# Patient Record
Sex: Female | Born: 1975
Health system: Southern US, Community
[De-identification: ages and names within clinical notes are randomized; demographics above are authoritative.]

## PROBLEM LIST (undated history)

## (undated) DIAGNOSIS — E079 Disorder of thyroid, unspecified: Secondary | ICD-10-CM

## (undated) DIAGNOSIS — F419 Anxiety disorder, unspecified: Secondary | ICD-10-CM

## (undated) DIAGNOSIS — T7840XA Allergy, unspecified, initial encounter: Secondary | ICD-10-CM

## (undated) DIAGNOSIS — I639 Cerebral infarction, unspecified: Secondary | ICD-10-CM

## (undated) DIAGNOSIS — F32A Depression, unspecified: Secondary | ICD-10-CM

## (undated) HISTORY — DX: Depression, unspecified: F32.A

## (undated) HISTORY — DX: Allergy, unspecified, initial encounter: T78.40XA

## (undated) HISTORY — DX: Cerebral infarction, unspecified: I63.9

## (undated) HISTORY — DX: Disorder of thyroid, unspecified: E07.9

---

## 2000-01-28 ENCOUNTER — Emergency Department (HOSPITAL_COMMUNITY): Admission: EM | Admit: 2000-01-28 | Discharge: 2000-01-28 | Payer: Self-pay | Admitting: Emergency Medicine

## 2002-05-07 ENCOUNTER — Emergency Department (HOSPITAL_COMMUNITY): Admission: EM | Admit: 2002-05-07 | Discharge: 2002-05-07 | Payer: Self-pay | Admitting: Emergency Medicine

## 2002-09-06 ENCOUNTER — Emergency Department (HOSPITAL_COMMUNITY): Admission: EM | Admit: 2002-09-06 | Discharge: 2002-09-06 | Payer: Self-pay | Admitting: Emergency Medicine

## 2006-04-26 ENCOUNTER — Inpatient Hospital Stay (HOSPITAL_COMMUNITY): Admission: AD | Admit: 2006-04-26 | Discharge: 2006-04-26 | Payer: Self-pay | Admitting: Obstetrics and Gynecology

## 2006-05-03 ENCOUNTER — Inpatient Hospital Stay (HOSPITAL_COMMUNITY): Admission: RE | Admit: 2006-05-03 | Discharge: 2006-05-03 | Payer: Self-pay | Admitting: Obstetrics & Gynecology

## 2006-05-04 ENCOUNTER — Encounter (INDEPENDENT_AMBULATORY_CARE_PROVIDER_SITE_OTHER): Payer: Self-pay | Admitting: Specialist

## 2006-05-04 ENCOUNTER — Ambulatory Visit (HOSPITAL_COMMUNITY): Admission: RE | Admit: 2006-05-04 | Discharge: 2006-05-04 | Payer: Self-pay | Admitting: Obstetrics and Gynecology

## 2006-05-23 ENCOUNTER — Inpatient Hospital Stay (HOSPITAL_COMMUNITY): Admission: AD | Admit: 2006-05-23 | Discharge: 2006-05-23 | Payer: Self-pay | Admitting: Obstetrics and Gynecology

## 2007-02-07 ENCOUNTER — Emergency Department (HOSPITAL_COMMUNITY): Admission: EM | Admit: 2007-02-07 | Discharge: 2007-02-07 | Payer: Self-pay | Admitting: Family Medicine

## 2008-04-28 ENCOUNTER — Emergency Department (HOSPITAL_COMMUNITY): Admission: EM | Admit: 2008-04-28 | Discharge: 2008-04-28 | Payer: Self-pay | Admitting: Emergency Medicine

## 2010-12-25 NOTE — Op Note (Signed)
Gina Ayala, Gina Ayala               ACCOUNT NO.:  [id number]   MEDICAL RECORD NO.:  [id number]          PATIENT TYPE:  AMB   LOCATION:  SDC                           FACILITY:  [facility]   PHYSICIAN:  Gerald Leitz, MD          DATE OF BIRTH:  1976-02-07   DATE OF PROCEDURE:  05/04/2006  DATE OF DISCHARGE:  05/04/2006                                 OPERATIVE REPORT   PREOPERATIVE DIAGNOSIS:  Missed abortion.   POSTOPERATIVE DIAGNOSIS:  Missed abortion.   PROCEDURE:  Dilatation and curettage.   SURGEON:  Gerald Leitz, MD   ASSISTANT:  None.   ANESTHESIA:  MAC.   SPECIMENS:  Products of conception, sent to Pathology.   ESTIMATED BLOOD LOSS:  Minimal.   COMPLICATIONS:  None.   INDICATION:  This is a 35 year old at approximately 7 weeks 5 days'  estimated gestational age with irregular gestational sac, no fluid, fetal  pole or yolk sac on ultrasound, quantitative beta hCG of 19,000, diagnosed  with a blighted ovum.  Risks, benefits and alternatives of the surgery were  discussed with the patient; she desired dilation and suction curettage.   DESCRIPTION OF PROCEDURE:  She was taken to the operating room and placed  under MAC.  She was then prepped and draped in the usual sterile fashion.  A  bivalved speculum was placed in the vaginal vault.  The anterior lip of the  cervix was grasped with a single-tooth tenaculum.  Paracervical block was  then obtained with 10 mL of 0.25% Marcaine plain.  The uterus was sounded to  7 cm.  The cervix was dilated up to a #21 dilator.  A 7-mm suction curette  was then introduced and products of conception were removed using usual  sterile technique.  Light sharp curettage was performed until a gritty  texture was noted all the way around and suction curettage was performed  once again to make sure all products were removed.  Single-tooth tenaculum  was removed from the patient's cervix; excellent hemostasis was noted.  The  speculum was removed from the  vagina.  The patient was awakened from MAC and  taken to the recovery room, awake and in stable condition.     Gerald Leitz, MD  Electronically Signed    TC/MEDQ  D:  05/04/2006  T:  05/06/2006  Job:  [id number]

## 2010-12-25 NOTE — H&P (Signed)
NAMEMELANIA, Gina Ayala               ACCOUNT NO.:  [id number]   MEDICAL RECORD NO.:  [id number]          PATIENT TYPE:  MAT   LOCATION:  MATC                          FACILITY:  [facility]   PHYSICIAN:  Gerald Leitz, MD          DATE OF BIRTH:  May 12, 1976   DATE OF ADMISSION:  05/03/2006  DATE OF DISCHARGE:                                HISTORY & PHYSICAL   HISTORY OF PRESENT ILLNESS:  This is a 35 year old G 5, P 1-0-3-1, at  approximately 7 weeks 5 days estimated gestational age with a missed AB.  She had an ultrasound on April 26, 2006, at [facility] that showed  an intrauterine gestational sack measuring 17.6-mm.  No yolk sack or fetal  pole was seen.  She was estimated to be 6 weeks and 5 days at that time.  She returned on May 04, 2006, had a repeat ultrasound and the  gestational sack had increased, however, there was no fetal pole, diagnosed  with a blighted ovum.  After counseling and all options were discussed, the  patient decided on dilatation and suction curettage.   CURRENT MEDICATIONS:  Xanax 1 mg three times a day for anxiety.   ALLERGIES:  NO KNOWN DRUG ALLERGIES.   PAST MEDICAL HISTORY:  1. Anxiety.  2. Migraines.   SURGERY:  1. D&C.  2. C-section x1.   PAST OBSTETRICAL HISTORY:  1. C-section x1.  2. SAB x2.  3. IAB x1.   PAST GYNECOLOGIC HISTORY:  1. History of Chlamydia.  2. Urinary tract infections.  3. History of abnormal Pap smears.   SOCIAL HISTORY:  The patient is divorced.  Reports occasional alcohol.  She  smokes 1/2 pack per day.  Denies any current illicit drug use.   FAMILY HISTORY:  Noncontributory.   REVIEW OF SYSTEMS:  Positive for nausea otherwise negative except as stated  in history of current illness.  Denies any vaginal bleeding.  No passage of  tissue.   PHYSICAL EXAMINATION:  VITAL SIGNS:  Blood pressure 100/70, heart rate 68,  height 5 feet 6-1/2 inches, weight 130 pounds.  CARDIOVASCULAR:  Regular rate and rhythm.  LUNGS:  Clear to auscultation bilaterally.  ABDOMEN:  Soft, nontender, nondistended.  No masses.  PELVIC:  Normal external female genitalia.  No vulvar, vaginal, cervical  lesions are noted.  Bimanual exam reveals a normal size uterus.  No adnexal  masses or tenderness.   LABORATORY:  Blood type A positive.  Beta HCG on April 26, 2006, was  19,484.   ASSESSMENT:  1. Missed abortion.  2. Blighted ovum.   PLAN:  Risks, benefits, alternatives of D&C were discussed with the patient  including, but not limited to, infection, bleeding, damage to the uterus,  uterine perforation with need for further surgery, risk of transfusion, HIV,  hepatitis B and C were discussed with the patient and the patient  understands all risks and alternatives, desires to proceed with dilatation  and suction curettage.      Gerald Leitz, MD  Electronically Signed     TC/MEDQ  D:  05/03/2006  T:  05/03/2006  Job:  [id number]

## 2015-05-19 ENCOUNTER — Emergency Department (HOSPITAL_COMMUNITY)
Admission: EM | Admit: 2015-05-19 | Discharge: 2015-05-19 | Disposition: A | Discharge status: Home / Self Care | Payer: Self-pay

## 2015-05-19 NOTE — ED Notes (Signed)
Pt not in lobby when called, per family member, pt left due wait time.

## 2015-08-10 DIAGNOSIS — I639 Cerebral infarction, unspecified: Secondary | ICD-10-CM

## 2015-08-10 HISTORY — DX: Cerebral infarction, unspecified: I63.9

## 2015-11-11 ENCOUNTER — Encounter (HOSPITAL_COMMUNITY): Payer: Self-pay

## 2015-11-11 ENCOUNTER — Emergency Department (HOSPITAL_COMMUNITY): Payer: Self-pay

## 2015-11-11 ENCOUNTER — Inpatient Hospital Stay (HOSPITAL_COMMUNITY)
Admission: EM | Admit: 2015-11-11 | Discharge: 2015-11-14 | DRG: 065 | Disposition: A | Discharge status: Home / Self Care | Payer: Self-pay | Attending: Internal Medicine | Admitting: Internal Medicine

## 2015-11-11 DIAGNOSIS — I63411 Cerebral infarction due to embolism of right middle cerebral artery: Principal | ICD-10-CM | POA: Diagnosis present

## 2015-11-11 DIAGNOSIS — R4781 Slurred speech: Secondary | ICD-10-CM | POA: Diagnosis present

## 2015-11-11 DIAGNOSIS — G459 Transient cerebral ischemic attack, unspecified: Secondary | ICD-10-CM | POA: Insufficient documentation

## 2015-11-11 DIAGNOSIS — R51 Headache: Secondary | ICD-10-CM

## 2015-11-11 DIAGNOSIS — R519 Headache, unspecified: Secondary | ICD-10-CM

## 2015-11-11 DIAGNOSIS — F17200 Nicotine dependence, unspecified, uncomplicated: Secondary | ICD-10-CM | POA: Diagnosis present

## 2015-11-11 DIAGNOSIS — E876 Hypokalemia: Secondary | ICD-10-CM | POA: Diagnosis present

## 2015-11-11 DIAGNOSIS — F411 Generalized anxiety disorder: Secondary | ICD-10-CM | POA: Diagnosis present

## 2015-11-11 DIAGNOSIS — R29810 Facial weakness: Secondary | ICD-10-CM | POA: Diagnosis present

## 2015-11-11 DIAGNOSIS — I639 Cerebral infarction, unspecified: Secondary | ICD-10-CM

## 2015-11-11 DIAGNOSIS — I63511 Cerebral infarction due to unspecified occlusion or stenosis of right middle cerebral artery: Secondary | ICD-10-CM | POA: Diagnosis present

## 2015-11-11 DIAGNOSIS — E872 Acidosis: Secondary | ICD-10-CM | POA: Diagnosis present

## 2015-11-11 DIAGNOSIS — R29700 NIHSS score 0: Secondary | ICD-10-CM | POA: Diagnosis present

## 2015-11-11 HISTORY — DX: Anxiety disorder, unspecified: F41.9

## 2015-11-11 LAB — COMPREHENSIVE METABOLIC PANEL
ALT: 67 U/L — ABNORMAL HIGH (ref 14–54)
ANION GAP: 11 (ref 5–15)
AST: 54 U/L — AB (ref 15–41)
Albumin: 3.8 g/dL (ref 3.5–5.0)
Alkaline Phosphatase: 58 U/L (ref 38–126)
BILIRUBIN TOTAL: 0.7 mg/dL (ref 0.3–1.2)
BUN: 8 mg/dL (ref 6–20)
CHLORIDE: 105 mmol/L (ref 101–111)
CO2: 24 mmol/L (ref 22–32)
Calcium: 9.4 mg/dL (ref 8.9–10.3)
Creatinine, Ser: 0.71 mg/dL (ref 0.44–1.00)
Glucose, Bld: 128 mg/dL — ABNORMAL HIGH (ref 65–99)
POTASSIUM: 3.3 mmol/L — AB (ref 3.5–5.1)
Sodium: 140 mmol/L (ref 135–145)
Total Protein: 6.8 g/dL (ref 6.5–8.1)

## 2015-11-11 LAB — I-STAT TROPONIN, ED: Troponin i, poc: 0 ng/mL (ref 0.00–0.08)

## 2015-11-11 LAB — CBC
HEMATOCRIT: 45.8 % (ref 36.0–46.0)
HEMATOCRIT: 44.7 % (ref 36.0–46.0)
HEMOGLOBIN: 16.2 g/dL — AB (ref 12.0–15.0)
Hemoglobin: 15.5 g/dL — ABNORMAL HIGH (ref 12.0–15.0)
MCH: 32.6 pg (ref 26.0–34.0)
MCH: 33.9 pg (ref 26.0–34.0)
MCHC: 35.4 g/dL (ref 30.0–36.0)
MCHC: 34.7 g/dL (ref 30.0–36.0)
MCV: 93.9 fL (ref 78.0–100.0)
MCV: 95.8 fL (ref 78.0–100.0)
PLATELETS: 379 10*3/uL (ref 150–400)
Platelets: 420 10*3/uL — ABNORMAL HIGH (ref 150–400)
RBC: 4.76 MIL/uL (ref 3.87–5.11)
RBC: 4.78 MIL/uL (ref 3.87–5.11)
RDW: 13 % (ref 11.5–15.5)
RDW: 12.8 % (ref 11.5–15.5)
WBC: 12.3 10*3/uL — ABNORMAL HIGH (ref 4.0–10.5)
WBC: 11.3 10*3/uL — AB (ref 4.0–10.5)

## 2015-11-11 LAB — APTT: APTT: 26 s (ref 24–37)

## 2015-11-11 LAB — DIFFERENTIAL
BASOS PCT: 0 %
Basophils Absolute: 0 10*3/uL (ref 0.0–0.1)
EOS ABS: 0.1 10*3/uL (ref 0.0–0.7)
EOS PCT: 1 %
LYMPHS ABS: 3.5 10*3/uL (ref 0.7–4.0)
Lymphocytes Relative: 28 %
MONO ABS: 1.4 10*3/uL — AB (ref 0.1–1.0)
MONOS PCT: 11 %
Neutro Abs: 7.3 10*3/uL (ref 1.7–7.7)
Neutrophils Relative %: 60 %

## 2015-11-11 LAB — I-STAT CHEM 8, ED
BUN: 8 mg/dL (ref 6–20)
CREATININE: 0.7 mg/dL (ref 0.44–1.00)
Calcium, Ion: 1.14 mmol/L (ref 1.12–1.23)
Chloride: 102 mmol/L (ref 101–111)
GLUCOSE: 120 mg/dL — AB (ref 65–99)
HCT: 46 % (ref 36.0–46.0)
HEMOGLOBIN: 15.6 g/dL — AB (ref 12.0–15.0)
POTASSIUM: 3.2 mmol/L — AB (ref 3.5–5.1)
Sodium: 140 mmol/L (ref 135–145)
TCO2: 25 mmol/L (ref 0–100)

## 2015-11-11 LAB — PROTIME-INR
INR: 1.05 (ref 0.00–1.49)
Prothrombin Time: 13.9 seconds (ref 11.6–15.2)

## 2015-11-11 MED ORDER — ALPRAZOLAM 0.5 MG PO TABS
0.5000 mg | ORAL_TABLET | Freq: Every evening | ORAL | Status: DC | PRN
  Administered 2015-11-11: 0.5 mg via ORAL
  Filled 2015-11-11: qty 1

## 2015-11-11 MED ORDER — STROKE: EARLY STAGES OF RECOVERY BOOK
Freq: Once | Status: AC
  Administered 2015-11-11: 23:00:00

## 2015-11-11 MED ORDER — SODIUM CHLORIDE 0.9 % IV SOLN
INTRAVENOUS | Status: AC
  Administered 2015-11-11 – 2015-11-12 (×2): via INTRAVENOUS

## 2015-11-11 MED ORDER — ASPIRIN 300 MG RE SUPP: 300.0000 mg | Freq: Every day | RECTAL | Status: DC

## 2015-11-11 MED ORDER — ENOXAPARIN SODIUM 40 MG/0.4ML ~~LOC~~ SOLN
40.0000 mg | Freq: Every day | SUBCUTANEOUS | Status: DC
  Administered 2015-11-11 – 2015-11-13 (×3): 40 mg via SUBCUTANEOUS
  Filled 2015-11-11 (×3): qty 0.4

## 2015-11-11 MED ORDER — ACETAMINOPHEN 325 MG PO TABS
650.0000 mg | ORAL_TABLET | Freq: Four times a day (QID) | ORAL | Status: DC | PRN
  Administered 2015-11-11 – 2015-11-12 (×2): 650 mg via ORAL
  Filled 2015-11-11 (×2): qty 2

## 2015-11-11 MED ORDER — ASPIRIN 325 MG PO TABS
325.0000 mg | ORAL_TABLET | Freq: Every day | ORAL | Status: DC
  Administered 2015-11-12 – 2015-11-13 (×2): 325 mg via ORAL
  Filled 2015-11-11 (×3): qty 1

## 2015-11-11 MED ORDER — SENNOSIDES-DOCUSATE SODIUM 8.6-50 MG PO TABS: 1.0000 | ORAL_TABLET | Freq: Every evening | ORAL | Status: DC | PRN

## 2015-11-11 NOTE — ED Provider Notes (Signed)
CSN: [id number]     Arrival date & time 11/11/15  1744 History   First MD Initiated Contact with Patient 11/11/15 1749     Chief Complaint  Patient presents with  . Numbness     (Consider location/radiation/quality/duration/timing/severity/associated sxs/prior Treatment) HPI  40 year old female resents with acute left-sided weakness and slurred speech with facial droop. History is taken by significant other who is at the bedside. Patient about one hour prior to arrival was getting ready to take a nap and then all of a sudden her face seemed to freeze she seemed to not be acting right and not be there. She fell off the bed in between the bed and dresser. She did not seem to injure her head and has no headache. Immediately after she was acting confused but also could not move her left arm. She had picked up with her right arm. She also had a facial droop and some slurred speech. This lasted several minutes until EMS arrived and her symptoms seemed to resolve. She denies facial numbness, numbness or weakness in her extremity, or headache. Patient is a daily smoker but has not smoked in 1 week and plans on quitting. No other medical problems.  Past Medical History  Diagnosis Date  . Anxiety    History reviewed. No pertinent past surgical history. No family history on file. Social History  Substance Use Topics  . Smoking status: Current Every Day Smoker  . Smokeless tobacco: None  . Alcohol Use: None   OB History    No data available     Review of Systems  Respiratory: Negative for shortness of breath.   Cardiovascular: Negative for chest pain.  Gastrointestinal: Negative for vomiting.  Musculoskeletal: Negative for neck pain.  Neurological: Positive for speech difficulty, weakness and numbness. Negative for headaches.  All other systems reviewed and are negative.     Allergies  Review of patient's allergies indicates not on file.  Home Medications   Prior to Admission  medications   Not on File   BP 116/88 mmHg  Pulse 83  Temp(Src) 98.4 F (36.9 C) (Oral)  Resp 16  Ht  (1.676 m)  Wt 180 lb (81.647 kg)  BMI 29.07 kg/m2  SpO2 94% Physical Exam  Constitutional: She is oriented to person, place, and time. She appears well-developed and well-nourished.  HENT:  Head: Normocephalic and atraumatic.  Right Ear: External ear normal.  Left Ear: External ear normal.  Nose: Nose normal.  No signs of head trauma  Eyes: EOM are normal. Pupils are equal, round, and reactive to light. Right eye exhibits no discharge. Left eye exhibits no discharge.  Neck: Neck supple.  Cardiovascular: Normal rate, regular rhythm and normal heart sounds.   Pulmonary/Chest: Effort normal and breath sounds normal.  Abdominal: Soft. There is no tenderness.  Neurological: She is alert and oriented to person, place, and time.  CN 2-12 grossly intact. 5/5 strength in all 4 extremities. Normal gross sensation. Normal finger to nose  Skin: Skin is warm and dry.  Nursing note and vitals reviewed.   ED Course  Procedures (including critical care time) Labs Review Labs Reviewed  CBC - Abnormal; Notable for the following:    WBC 12.3 (*)    Hemoglobin 16.2 (*)    Platelets 420 (*)    All other components within normal limits  DIFFERENTIAL - Abnormal; Notable for the following:    Monocytes Absolute 1.4 (*)    All other components within normal limits  COMPREHENSIVE METABOLIC PANEL - Abnormal; Notable for the following:    Potassium 3.3 (*)    Glucose, Bld 128 (*)    AST 54 (*)    ALT 67 (*)    All other components within normal limits  I-STAT CHEM 8, ED - Abnormal; Notable for the following:    Potassium 3.2 (*)    Glucose, Bld 120 (*)    Hemoglobin 15.6 (*)    All other components within normal limits  PROTIME-INR  APTT  ETHANOL  URINE RAPID DRUG SCREEN, HOSP PERFORMED  URINALYSIS, ROUTINE W REFLEX MICROSCOPIC (NOT AT [facility])  PREGNANCY, URINE  ANTITHROMBIN III   PROTEIN C ACTIVITY  PROTEIN C, TOTAL  PROTEIN S ACTIVITY  PROTEIN S, TOTAL  LUPUS ANTICOAGULANT PANEL  BETA-2-GLYCOPROTEIN I ABS, IGG/M/A  HOMOCYSTEINE  FACTOR 5 LEIDEN  PROTHROMBIN GENE MUTATION  CARDIOLIPIN ANTIBODIES, IGG, IGM, IGA  HEMOGLOBIN A1C  LIPID PANEL  CBC  CREATININE, SERUM  COMPREHENSIVE METABOLIC PANEL  CBC  I-STAT TROPOININ, ED    Imaging Review Mr Brain Wo Contrast (neuro Protocol)  11/11/2015  CLINICAL DATA:  Transient left-sided weakness. History of anxiety, over the counter cold medications, and daily smoking history. Symptoms reportedly developed earlier today at 1715 hours. Patient states she is back to normal. EXAM: MRI HEAD WITHOUT CONTRAST TECHNIQUE: Multiplanar, multiecho pulse sequences of the brain and surrounding structures were obtained without intravenous contrast. COMPARISON:  None. FINDINGS: Slight motion.  Overall study diagnostic. Multifocal areas of acute infarction affect the RIGHT hemisphere. These are most notable in the RIGHT insula, RIGHT posterior temporal lobe, RIGHT anterior parietal lobe, RIGHT frontal and RIGHT posterior frontal lobe, and regional white matter. The largest confluent area affects the RIGHT parietal lobe, and could correlate with the patient's sensory symptoms. Within limits for detection on MR, these infarcts are all are nonhemorrhagic. No left-sided or posterior circulation involvement. No hemorrhage, mass lesion, hydrocephalus, or extra-axial fluid. Slight premature for age atrophy. Minor white matter disease, nonspecific. Flow voids are maintained. No visible stenosis, dissection, or large vessel occlusion No midline abnormality. Extracranial soft tissues unremarkable. LEFT vertebral dominant. IMPRESSION: Multifocal areas of acute infarction affecting the RIGHT MCA territory. These appear nonhemorrhagic, but noncontrast CT should be obtained to confirm. Slight premature for age atrophy, minor white matter disease, nonspecific.  These results were called by telephone at the time of interpretation on 11/11/2015 at 8:59 pm to Dr. Pricilla Loveless , who verbally acknowledged these results. Electronically Signed   By: Elsie Stain M.D.   On: 11/11/2015 21:01   I have personally reviewed and evaluated these images and lab results as part of my medical decision-making.   EKG Interpretation   Date/Time:  Tuesday November 11 2015 18:52:39 EDT Ventricular Rate:  81 PR Interval:  177 QRS Duration: 98 QT Interval:  389 QTC Calculation: 451 R Axis:   89 Text Interpretation:  Sinus rhythm Normal ECG No old tracing to compare  Confirmed by Aizlynn Digilio  MD, Horst Ostermiller ([id number]) on 11/11/2015 7:20:01 PM      MDM   Final diagnoses:  None    She is completely asymptomatic on arrival. Patient has not had any return of symptoms. Symptoms are most consistent with a TIA. Discussed with Dr. Lavon Paganini recommends MRI and skips head CT given no headache and low suspicion for bleed. MRI does show areas of infarct in right MCA territory. On reexamination patient is still asymptomatic. Discussed with Dr. Cherylynn Ridges who will consult, recommends hospitalist admission for TIA workup.  Pricilla LovelessScott Tama Grosz, MD 11/11/15 [id number]

## 2015-11-11 NOTE — H&P (Signed)
[facility] History and Physical  Gina RubensteinChrista L Ayala UJW:[id number]:[id number] DOB: Oct 09, 1975 DOA: 11/11/2015  Referring physician: Malachi Carlr.[place]. PCP: No PCP Per Patient  Specialists: None.  Chief Complaint: Left-sided weakness and loss of consciousness.  HPI: Gina Ayala is a 40 y.o. female   with history of tobacco abuse at around 5 PM this evening suddenly lost consciousness while she was with her husband. On waking up immediately patient found that she was unable to move her left upper extremity and also the husband noticed left facial droop. By the time patient reached ER patient's focal deficits and resolved. Denies any difficulty seeing speaking or swallowing. On exam patient is able to move all extremities 5 x 5. On-call neurologist Dr. Avon GullyShickman has been consulted and patient will be admitted for further management. On my exam patient complains of right sided neck pain and right headache. Which is new for the patient. Patient denies any incontinence of urine or tongue bite. Patient's husband has not noticed any seizure-like activities.   Review of Systems: As presented in the history of presenting illness, rest negative.  Past Medical History  Diagnosis Date  . Anxiety    Past Surgical History  Procedure Laterality Date  . Cesarean section     Social History:  reports that she has been smoking.  She does not have any smokeless tobacco history on file. She reports that she drinks alcohol. Her drug history is not on file. Where does patient lives -Home. Can patient participate in ADLs?  yes.  Allergies  Allergen Reactions  . Anesthetics, Amide     Itching     Family History:  Family History  Problem Relation Age of Onset  . Diabetes Mellitus II Mother       Prior to Admission medications   Medication Sig Start Date End Date Taking? Authorizing Provider  ibuprofen (ADVIL,MOTRIN) 200 MG tablet Take 200 mg by mouth every 6 (six) hours as needed.   Yes Historical Provider, MD     Physical Exam: Filed Vitals:   11/11/15 1805 11/11/15 1830 11/11/15 1900 11/11/15 2046  BP: 130/90 132/87 116/88   Pulse: 92 81 83   Temp: 98.4 F (36.9 C)   98 F (36.7 C)  TempSrc: Oral     Resp:   16   Height: 5\' 6"  (1.676 m)     Weight: 180 lb (81.647 kg)     SpO2: 96% 96% 94%      General:  Moderately built and nourished.  Eyes: Anicteric no pallor.  ENT: No discharge from the ears eyes nose mouth and  Neck: No mass felt. No neck rigidity. No bruit.  Cardiovascular: S1-S2 heard.  Respiratory: No rhonchi or crepitations.  Abdomen: Soft nontender bowel sounds present.  Skin: No rash.  Musculoskeletal: No edema.  Psychiatric: Appears normal.  Neurologic: Alert awake oriented to time place and person. Moves all extremities 5 x 5. No facial asymmetry. Tongue is midline. PERRLA positive.  Labs on Admission:  Basic Metabolic Panel:  Recent Labs Lab 11/11/15 1900 11/11/15 1911  NA 140 140  K 3.3* 3.2*  CL 105 102  CO2 24  --   GLUCOSE 128* 120*  BUN 8 8  CREATININE 0.71 0.70  CALCIUM 9.4  --    Liver Function Tests:  Recent Labs Lab 11/11/15 1900  AST 54*  ALT 67*  ALKPHOS 58  BILITOT 0.7  PROT 6.8  ALBUMIN 3.8   No results for input(s): LIPASE, AMYLASE in the last 168 hours.  No results for input(s): AMMONIA in the last 168 hours. CBC:  Recent Labs Lab 11/11/15 1900 11/11/15 1911  WBC 12.3*  --   NEUTROABS 7.3  --   HGB 16.2* 15.6*  HCT 45.8 46.0  MCV 95.8  --   PLT 420*  --    Cardiac Enzymes: No results for input(s): CKTOTAL, CKMB, CKMBINDEX, TROPONINI in the last 168 hours.  BNP (last 3 results) No results for input(s): BNP in the last 8760 hours.  ProBNP (last 3 results) No results for input(s): PROBNP in the last 8760 hours.  CBG: No results for input(s): GLUCAP in the last 168 hours.  Radiological Exams on Admission: Mr Brain Wo Contrast (neuro Protocol)  11/11/2015  CLINICAL DATA:  Transient left-sided weakness.  History of anxiety, over the counter cold medications, and daily smoking history. Symptoms reportedly developed earlier today at 1715 hours. Patient states she is back to normal. EXAM: MRI HEAD WITHOUT CONTRAST TECHNIQUE: Multiplanar, multiecho pulse sequences of the brain and surrounding structures were obtained without intravenous contrast. COMPARISON:  None. FINDINGS: Slight motion.  Overall study diagnostic. Multifocal areas of acute infarction affect the RIGHT hemisphere. These are most notable in the RIGHT insula, RIGHT posterior temporal lobe, RIGHT anterior parietal lobe, RIGHT frontal and RIGHT posterior frontal lobe, and regional white matter. The largest confluent area affects the RIGHT parietal lobe, and could correlate with the patient's sensory symptoms. Within limits for detection on MR, these infarcts are all are nonhemorrhagic. No left-sided or posterior circulation involvement. No hemorrhage, mass lesion, hydrocephalus, or extra-axial fluid. Slight premature for age atrophy. Minor white matter disease, nonspecific. Flow voids are maintained. No visible stenosis, dissection, or large vessel occlusion No midline abnormality. Extracranial soft tissues unremarkable. LEFT vertebral dominant. IMPRESSION: Multifocal areas of acute infarction affecting the RIGHT MCA territory. These appear nonhemorrhagic, but noncontrast CT should be obtained to confirm. Slight premature for age atrophy, minor white matter disease, nonspecific. These results were called by telephone at the time of interpretation on 11/11/2015 at 8:59 pm to Dr. Pricilla Loveless , who verbally acknowledged these results. Electronically Signed   By: Elsie Stain M.D.   On: 11/11/2015 21:01    EKG: Independently reviewed. Normal sinus rhythm.  Assessment/Plan Principal Problem:   Arterial ischemic stroke, MCA (middle cerebral artery), right, acute (HCC) Active Problems:   Acute right MCA stroke (HCC)   1. Stroke - have discussed with  on-call neurologist Dr. Avon Gully will be seen patient in consult. Since patient has neck pain and right-sided headache CT angiogram of the head and neck as been ordered. Patient will be placed on neurochecks will evaluation. Check 2-D echo and follow telemetry for arrhythmias. Check lipid panel and hemoglobin A1c. Patient is on aspirin for now. Check hypercoagulable workup. 2. Tobacco abuse - patient advised to quit smoking.   DVT Prophylaxis Lovenox.  Code Status: Full code.  Family Communication: Discussed with patient's mother and husband.  Disposition Plan: Admit to inpatient.    Wilburta Milbourn N. [facility] Pager [phone].  If 7PM-7AM, please contact night-coverage [email or web address] Password [facility] 11/11/2015, 10:28 PM

## 2015-11-11 NOTE — ED Notes (Signed)
Dr. Kakrakandy at bedside. 

## 2015-11-11 NOTE — Progress Notes (Signed)
Patient admitted to 5M11. Patient is alert, oriented x4, states she has minor headache, very tearful, states her sister passed away at this hospital after having a stroke. MD notified, patient states she takes xanax at home. No skin issues, tele set up. Patient oriented to room, unit, staff, will continue to monitor closely.

## 2015-11-11 NOTE — ED Notes (Addendum)
Pt. BIB [facility] for evaluation of L facial numbness, L sided droop, and L sided weakness starting at 1715 which resolved in approx 2-3 min. EMS reports pt. Is tearful and did not want to come to ED. Significant other witnessed event and called 911. Pt. With hx of anxiety, recent URI taking OTC cold meds. Pt. Is daily smoker.

## 2015-11-11 NOTE — Consult Note (Signed)
NEURO HOSPITALIST CONSULT NOTE      Reason for Consult: new stroke on MRI and L sided weakness   History obtained from:  Patient     HPI:                                                                                                                                          Gina Ayala is an 40 y.o. female with hx of only smoking presented to the ER with an episode of L arm weakness and numbness and possible L facial droop that lasted 5 minutes earlier midday today. Upon arrival to the ER she had a NIHSS of zero, MRI was done that showed multifocal R MCA area strokes.   Past Medical History  Diagnosis Date  . Anxiety     Past Surgical History  Procedure Laterality Date  . Cesarean section      Family History  Problem Relation Age of Onset  . Diabetes Mellitus II Mother       Social History:  reports that she has been smoking.  She does not have any smokeless tobacco history on file. She reports that she drinks alcohol. Her drug history is not on file.  Allergies  Allergen Reactions  . Anesthetics, Amide     Itching     MEDICATIONS:                                                                                                                     I have reviewed the patient's current medications.   ROS:  History obtained from the patient  General ROS: negative for - chills, fatigue, fever, night sweats, weight gain or weight loss Psychological ROS: negative for - behavioral disorder, hallucinations, memory difficulties, mood swings or suicidal ideation Ophthalmic ROS: negative for - blurry vision, double vision, eye pain or loss of vision ENT ROS: negative for - epistaxis, nasal discharge, oral lesions, sore throat, tinnitus or vertigo Allergy and Immunology ROS: negative for - hives or itchy/watery  eyes Hematological and Lymphatic ROS: negative for - bleeding problems, bruising or swollen lymph nodes Endocrine ROS: negative for - galactorrhea, hair pattern changes, polydipsia/polyuria or temperature intolerance Respiratory ROS: negative for - cough, hemoptysis, shortness of breath or wheezing Cardiovascular ROS: negative for - chest pain, dyspnea on exertion, edema or irregular heartbeat Gastrointestinal ROS: negative for - abdominal pain, diarrhea, hematemesis, nausea/vomiting or stool incontinence Genito-Urinary ROS: negative for - dysuria, hematuria, incontinence or urinary frequency/urgency Musculoskeletal ROS: negative for - joint swelling or muscular weakness Neurological ROS: as noted in HPI Dermatological ROS: negative for rash and skin lesion changes   Blood pressure 116/88, pulse 83, temperature 98 F (36.7 C), temperature source Oral, resp. rate 16, height  (1.676 m), weight 81.647 kg (180 lb), SpO2 94 %.   Neurologic Examination:                                                                                                      HEENT-  Normocephalic, no lesions, without obvious abnormality.  Normal external eye and conjunctiva.  Normal TM's bilaterally.  Normal auditory canals and external ears. Normal external nose, mucus membranes and septum.  Normal pharynx. Cardiovascular- regular rate and rhythm, S1, S2 normal, no murmur, click, rub or gallop, pulses palpable throughout   Lungs- chest clear, no wheezing, rales, normal symmetric air entry, Heart exam - S1, S2 normal, no murmur, no gallop, rate regular Abdomen- soft, non-tender; bowel sounds normal; no masses,  no organomegaly Extremities- no edema Lymph-no adenopathy palpable Musculoskeletal-no joint tenderness, deformity or swelling Skin-warm and dry, no hyperpigmentation, vitiligo, or suspicious lesions  Neurological Examination Mental Status: Alert, oriented, thought content appropriate.  Speech fluent  without evidence of aphasia.  Able to follow 3 step commands without difficulty. Cranial Nerves: II: Discs flat bilaterally; Visual fields grossly normal, pupils equal, round, reactive to light and accommodation III,IV, VI: ptosis not present, extra-ocular motions intact bilaterally V,VII: smile symmetric, facial light touch sensation normal bilaterally VIII: hearing normal bilaterally IX,X: uvula rises symmetrically XI: bilateral shoulder shrug XII: midline tongue extension Motor: Right : Upper extremity   5/5    Left:     Upper extremity   5/5, positive pronator drift  Lower extremity   5/5     Lower extremity   5/5 Tone and bulk:normal tone throughout; no atrophy noted Sensory: Decreased light touch on the LUE  Plantars: Right: downgoing   Left: downgoing Cerebellar: normal finger-to-nose, normal rapid alternating movements and normal heel-to-shin test       Lab Results: Basic Metabolic Panel:  Recent Labs Lab 11/11/15 1900 11/11/15 1911  NA 140 140  K 3.3* 3.2*  CL 105 102  CO2 24  --   GLUCOSE 128* 120*  BUN 8 8  CREATININE 0.71 0.70  CALCIUM 9.4  --     Liver Function Tests:  Recent Labs Lab 11/11/15 1900  AST 54*  ALT 67*  ALKPHOS 58  BILITOT 0.7  PROT 6.8  ALBUMIN 3.8   No results for input(s): LIPASE, AMYLASE in the last 168 hours. No results for input(s): AMMONIA in the last 168 hours.  CBC:  Recent Labs Lab 11/11/15 1900 11/11/15 1911  WBC 12.3*  --   NEUTROABS 7.3  --   HGB 16.2* 15.6*  HCT 45.8 46.0  MCV 95.8  --   PLT 420*  --     Cardiac Enzymes: No results for input(s): CKTOTAL, CKMB, CKMBINDEX, TROPONINI in the last 168 hours.  Lipid Panel: No results for input(s): CHOL, TRIG, HDL, CHOLHDL, VLDL, LDLCALC in the last 168 hours.  CBG: No results for input(s): GLUCAP in the last 168 hours.  Microbiology: No results found for this or any previous visit.  Coagulation Studies:  Recent Labs  11/11/15 1900  LABPROT 13.9   INR 1.05    Imaging: Mr Brain Wo Contrast (neuro Protocol)  11/11/2015  CLINICAL DATA:  Transient left-sided weakness. History of anxiety, over the counter cold medications, and daily smoking history. Symptoms reportedly developed earlier today at 1715 hours. Patient states she is back to normal. EXAM: MRI HEAD WITHOUT CONTRAST TECHNIQUE: Multiplanar, multiecho pulse sequences of the brain and surrounding structures were obtained without intravenous contrast. COMPARISON:  None. FINDINGS: Slight motion.  Overall study diagnostic. Multifocal areas of acute infarction affect the RIGHT hemisphere. These are most notable in the RIGHT insula, RIGHT posterior temporal lobe, RIGHT anterior parietal lobe, RIGHT frontal and RIGHT posterior frontal lobe, and regional white matter. The largest confluent area affects the RIGHT parietal lobe, and could correlate with the patient's sensory symptoms. Within limits for detection on MR, these infarcts are all are nonhemorrhagic. No left-sided or posterior circulation involvement. No hemorrhage, mass lesion, hydrocephalus, or extra-axial fluid. Slight premature for age atrophy. Minor white matter disease, nonspecific. Flow voids are maintained. No visible stenosis, dissection, or large vessel occlusion No midline abnormality. Extracranial soft tissues unremarkable. LEFT vertebral dominant. IMPRESSION: Multifocal areas of acute infarction affecting the RIGHT MCA territory. These appear nonhemorrhagic, but noncontrast CT should be obtained to confirm. Slight premature for age atrophy, minor white matter disease, nonspecific. These results were called by telephone at the time of interpretation on 11/11/2015 at 8:59 pm to Dr. Pricilla LovelessSCOTT GOLDSTON , who verbally acknowledged these results. Electronically Signed   By: Elsie StainJohn T Curnes M.D.   On: 11/11/2015 21:01        Assessment/Plan:  40 y.o. female with hx of only smoking presented to the ER with an episode of L arm weakness and  numbness and possible L facial droop that lasted 5 minutes earlier midday today. Upon arrival to the ER she had a NIHSS of zero, MRI was done that showed multifocal R MCA area strokes.   1. HgbA1c, fasting lipid panel 2. CTA head and neck 3. PT consult, OT consult, Speech consult 4. Echocardiogram 5. Carotid dopplers 6. Prophylactic therapy-Antiplatelet med: Aspirin - dose 325mg  daily 7. Risk factor modification 8. Telemetry monitoring 9. Frequent neuro checks 10 NPO until passes stroke swallow screen 11. May need hypercoagulable panel workup, LE US to rule out DVT if echo shows PFO

## 2015-11-12 ENCOUNTER — Inpatient Hospital Stay (HOSPITAL_COMMUNITY): Payer: Self-pay

## 2015-11-12 ENCOUNTER — Encounter (HOSPITAL_COMMUNITY): Payer: Self-pay | Admitting: Radiology

## 2015-11-12 DIAGNOSIS — E876 Hypokalemia: Secondary | ICD-10-CM

## 2015-11-12 DIAGNOSIS — R740 Nonspecific elevation of levels of transaminase and lactic acid dehydrogenase [LDH]: Secondary | ICD-10-CM

## 2015-11-12 DIAGNOSIS — F411 Generalized anxiety disorder: Secondary | ICD-10-CM

## 2015-11-12 DIAGNOSIS — I639 Cerebral infarction, unspecified: Secondary | ICD-10-CM

## 2015-11-12 DIAGNOSIS — I6789 Other cerebrovascular disease: Secondary | ICD-10-CM

## 2015-11-12 DIAGNOSIS — I63511 Cerebral infarction due to unspecified occlusion or stenosis of right middle cerebral artery: Secondary | ICD-10-CM

## 2015-11-12 LAB — ECHOCARDIOGRAM COMPLETE
Height: 66 in
WEIGHTICAEL: 2880 [oz_av]

## 2015-11-12 LAB — CBC
HCT: 42.2 % (ref 36.0–46.0)
Hemoglobin: 14.7 g/dL (ref 12.0–15.0)
MCH: 33.5 pg (ref 26.0–34.0)
MCHC: 34.8 g/dL (ref 30.0–36.0)
MCV: 96.1 fL (ref 78.0–100.0)
PLATELETS: 433 10*3/uL — AB (ref 150–400)
RBC: 4.39 MIL/uL (ref 3.87–5.11)
RDW: 13.1 % (ref 11.5–15.5)
WBC: 9.3 10*3/uL (ref 4.0–10.5)

## 2015-11-12 LAB — COMPREHENSIVE METABOLIC PANEL
ALK PHOS: 51 U/L (ref 38–126)
ALT: 64 U/L — AB (ref 14–54)
ANION GAP: 13 (ref 5–15)
AST: 51 U/L — ABNORMAL HIGH (ref 15–41)
Albumin: 3.3 g/dL — ABNORMAL LOW (ref 3.5–5.0)
BILIRUBIN TOTAL: 0.4 mg/dL (ref 0.3–1.2)
BUN: 6 mg/dL (ref 6–20)
CALCIUM: 8.7 mg/dL — AB (ref 8.9–10.3)
CO2: 22 mmol/L (ref 22–32)
CREATININE: 0.66 mg/dL (ref 0.44–1.00)
Chloride: 106 mmol/L (ref 101–111)
GFR calc non Af Amer: >60 mL/min (ref >60)
GLUCOSE: 105 mg/dL — AB (ref 65–99)
Potassium: 3 mmol/L — ABNORMAL LOW (ref 3.5–5.1)
SODIUM: 141 mmol/L (ref 135–145)
TOTAL PROTEIN: 6 g/dL — AB (ref 6.5–8.1)

## 2015-11-12 LAB — LIPID PANEL
CHOLESTEROL: 109 mg/dL (ref 0–200)
HDL: 32 mg/dL — AB (ref >40)
LDL CALC: 56 mg/dL (ref 0–99)
TRIGLYCERIDES: 106 mg/dL (ref <150)
Total CHOL/HDL Ratio: 3.4 RATIO
VLDL: 21 mg/dL (ref 0–40)

## 2015-11-12 LAB — PREGNANCY, URINE: Preg Test, Ur: NEGATIVE

## 2015-11-12 LAB — ANTITHROMBIN III: ANTITHROMB III FUNC: 113 % (ref 75–120)

## 2015-11-12 LAB — ETHANOL

## 2015-11-12 LAB — CREATININE, SERUM: Creatinine, Ser: 0.69 mg/dL (ref 0.44–1.00)

## 2015-11-12 MED ORDER — IOPAMIDOL (ISOVUE-370) INJECTION 76%
INTRAVENOUS | Status: AC
  Administered 2015-11-12: 50 mL
  Filled 2015-11-12: qty 50

## 2015-11-12 MED ORDER — ALPRAZOLAM 0.5 MG PO TABS
0.5000 mg | ORAL_TABLET | Freq: Two times a day (BID) | ORAL | Status: DC | PRN
  Administered 2015-11-12: 0.5 mg via ORAL
  Filled 2015-11-12 (×3): qty 1

## 2015-11-12 MED ORDER — POTASSIUM CHLORIDE CRYS ER 20 MEQ PO TBCR
40.0000 meq | EXTENDED_RELEASE_TABLET | Freq: Once | ORAL | Status: AC
  Administered 2015-11-12: 40 meq via ORAL
  Filled 2015-11-12: qty 2

## 2015-11-12 NOTE — Progress Notes (Signed)
[facility] PROGRESS NOTE  Gina Ayala EAV:[id number] DOB: 1976-07-05 DOA: 11/11/2015  PCP: No PCP Per Patient  Brief HPI: 40 year old Caucasian female with a past medical history of tobacco abuse and no other health problems except for anxiety, presented with left-sided weakness, facial droop. Symptoms resolved by the time the patient arrived at the emergency department. Patient was hospitalized for further management.  Past medical history:  Past Medical History  Diagnosis Date  . Anxiety     Consultants: Neurology  Procedures:  Transthoracic echocardiogram is pending  Antibiotics: None  Subjective: Patient is very anxious and teary. Her husband is at the bedside. She states that she feels like she is back to normal. Denies any chest pain or shortness of breath.  Objective: Vital Signs  Filed Vitals:   11/12/15 0454 11/12/15 0708 11/12/15 0847 11/12/15 1106  BP: 110/68 117/77 127/76 113/78  Pulse: 63  86 74  Temp: 98.2 F (36.8 C) 98.6 F (37 C) 99 F (37.2 C) 98.4 F (36.9 C)  TempSrc: Oral Oral Axillary Oral  Resp:  Height:      Weight:      SpO2: 95% 98% 98% 96%   No intake or output data in the 24 hours ending 11/12/15 1235 Filed Weights   11/11/15 1805  Weight: 81.647 kg (180 lb)    General appearance: alert, cooperative, appears stated age and no distress Resp: clear to auscultation bilaterally Cardio: regular rate and rhythm, S1, S2 normal, no murmur, click, rub or gallop GI: soft, non-tender; bowel sounds normal; no masses,  no organomegaly Extremities: extremities normal, atraumatic, no cyanosis or edema Neurologic: Awake and alert. Oriented 3. No facial asymmetry. Tongue is midline. Motor strength equal bilateral upper and lower extremities.  Lab Results:  Basic Metabolic Panel:  Recent Labs Lab 11/11/15 1900 11/11/15 1911 11/11/15 2345 11/12/15 0646  NA 140 140  --  141  K 3.3* 3.2*  --  3.0*  CL 105 102  --  106    CO2 24  --   --  22  GLUCOSE 128* 120*  --  105*  BUN 8 8  --  6  CREATININE 0.71 0.70 0.69 0.66  CALCIUM 9.4  --   --  8.7*   Liver Function Tests:  Recent Labs Lab 11/11/15 1900 11/12/15 0646  AST 54* 51*  ALT 67* 64*  ALKPHOS 58 51  BILITOT 0.7 0.4  PROT 6.8 6.0*  ALBUMIN 3.8 3.3*   CBC:  Recent Labs Lab 11/11/15 1900 11/11/15 1911 11/11/15 2345 11/12/15 0646  WBC 12.3*  --  11.3* 9.3  NEUTROABS 7.3  --   --   --   HGB 16.2* 15.6* 15.5* 14.7  HCT 45.8 46.0 44.7 42.2  MCV 95.8  --  93.9 96.1  PLT 420*  --  379 433*     Studies/Results: Ct Angio Head W/cm &/or Wo Cm  11/12/2015  ADDENDUM REPORT: 11/12/2015 08:02 ADDENDUM: There is a dictation error in the impression portion of the initially dictated report. Impression should state: Focal intimal irregularity (not regularity). Electronically Signed   By: Rise Mu M.D.   On: 11/12/2015 08:02  11/12/2015  CLINICAL DATA:  Initial evaluation for acute stroke. EXAM: CT ANGIOGRAPHY HEAD AND NECK TECHNIQUE: Multidetector CT imaging of the head and neck was performed using the standard protocol during bolus administration of intravenous contrast. Multiplanar CT image reconstructions and MIPs were obtained to evaluate the vascular anatomy. Carotid stenosis measurements (  when applicable) are obtained utilizing NASCET criteria, using the distal internal carotid diameter as the denominator. CONTRAST:  50 cc of Isovue 370. COMPARISON:  Prior brain MRI from 11/11/2015 FINDINGS: CT HEAD Subtle evolving areas of acute right MCA territory infarcts are seen, most evident within the right parietal lobe. These are better evaluated on previous brain MRI. No significant mass effect or associated hemorrhage. No other acute large vessel territory infarct. Deep gray nuclei maintained. No acute intracranial hemorrhage. No mass lesion or midline shift. No mass effect. No hydrocephalus. No extra-axial fluid collection. Scalp soft tissues  within normal limits. No acute abnormality about the orbits. Paranasal sinuses are clear.  No mastoid effusion. Calvarium intact. CTA NECK Aortic arch: Visualized aortic arch of normal caliber with normal branch pattern. No high-grade stenosis at the origin of the great vessels. Visualized subclavian arteries well opacified and normal in appearance. Right carotid system: Right common carotid artery widely patent at its origin. There is a focal filling defect arising from the medial aspect of the right common carotid artery approximately 1.5-2 cm proximal to the bifurcation (series 502, image 109). Intraluminal filling defects within the medial aspect of the right common carotid artery distal of ill, extending cephalad into the right carotid bifurcation. Filling defect extends up to the level of the origin of the right ICA. There is some extension into the proximal right ECA (series 502, image 121). Given the somewhat long segment nature of this finding, is small focal dissection is favored. There is mild to moderate stenosis of up to approximately 50% at the most narrow point. Distally, the right ICA is widely patent to the skullbase without other intimal irregularity, dissection, or stenosis. Left carotid system: Left common carotid artery patent from its origin to the bifurcation. Left ICA widely patent from the bifurcation to the skullbase. No dissection, stenosis, or vascular occlusion within the left carotid artery system. Vertebral arteries:Both vertebral arteries arise from the subclavian arteries. Left vertebral artery dominant. Vertebral arteries widely patent along their entire course without stenosis, dissection, or occlusion. Skeleton: No acute osseous abnormality. No worrisome lytic or blastic osseous lesions. Other neck: Visualized lungs are clear. Visualized superior mediastinum within normal limits. Thyroid gland normal. No adenopathy within the neck. No acute soft tissue abnormality. CTA HEAD  Anterior circulation: The petrous, cavernous, and supraclinoid segments of the ICAs are widely patent bilaterally. A1 segments, anterior communicating artery, and anterior cerebral arteries well opacified. M1 segments widely patent without stenosis or occlusion. No proximal M2 branch occlusion. Distal MCA branches fairly symmetric. Posterior circulation: Left vertebral artery dominant and widely patent to the vertebrobasilar junction. Diminutive right vertebral artery patent as well. Posterior inferior cerebral arteries patent. Basilar artery widely patent. Superior cerebral arteries patent bilaterally. Both posterior cerebral arteries arise from the basilar artery and are well opacified to their distal aspects. Venous sinuses: Patent without evidence for venous sinus thrombosis. Anatomic variants: No anatomic variant.  No aneurysm. Delayed phase: Minimal leptomeningeal enhancement about the evolving right MCA territory infarcts. No other pathologic enhancement. IMPRESSION: 1. Focal intimal regularity at the medial aspect of the distal right common carotid artery extending into the right carotid bifurcation, likely reflecting a small focal dissection. Finding could certainly represent an embolic source for patient's right MCA territory infarcts. 2. Otherwise normal CTA of the head and neck. 3. Evolving ischemic right posterior MCA territory infarcts. Results were called by telephone at the time of interpretation on 11/12/2015 at 4:26 am to Dr. Cherylynn Ridges, who verbally acknowledged these  results. Electronically Signed: By: Rise MuBenjamin  McClintock M.D. On: 11/12/2015 04:30   Dg Chest 2 View  11/12/2015  CLINICAL DATA:  Stroke. EXAM: CHEST  2 VIEW COMPARISON:  No recent prior. FINDINGS: Mediastinum hilar structures normal. Lungs are clear. Heart size normal. No pleural effusion or pneumothorax. Thoracic spine scoliosis. IMPRESSION: No acute cardiopulmonary disease. Electronically Signed   By: Maisie Fushomas  Register   On:  11/12/2015 07:24   Ct Angio Neck W/cm &/or Wo/cm  11/12/2015  ADDENDUM REPORT: 11/12/2015 08:02 ADDENDUM: There is a dictation error in the impression portion of the initially dictated report. Impression should state: Focal intimal irregularity (not regularity). Electronically Signed   By: Rise MuBenjamin  McClintock M.D.   On: 11/12/2015 08:02  11/12/2015  CLINICAL DATA:  Initial evaluation for acute stroke. EXAM: CT ANGIOGRAPHY HEAD AND NECK TECHNIQUE: Multidetector CT imaging of the head and neck was performed using the standard protocol during bolus administration of intravenous contrast. Multiplanar CT image reconstructions and MIPs were obtained to evaluate the vascular anatomy. Carotid stenosis measurements (when applicable) are obtained utilizing NASCET criteria, using the distal internal carotid diameter as the denominator. CONTRAST:  50 cc of Isovue 370. COMPARISON:  Prior brain MRI from 11/11/2015 FINDINGS: CT HEAD Subtle evolving areas of acute right MCA territory infarcts are seen, most evident within the right parietal lobe. These are better evaluated on previous brain MRI. No significant mass effect or associated hemorrhage. No other acute large vessel territory infarct. Deep gray nuclei maintained. No acute intracranial hemorrhage. No mass lesion or midline shift. No mass effect. No hydrocephalus. No extra-axial fluid collection. Scalp soft tissues within normal limits. No acute abnormality about the orbits. Paranasal sinuses are clear.  No mastoid effusion. Calvarium intact. CTA NECK Aortic arch: Visualized aortic arch of normal caliber with normal branch pattern. No high-grade stenosis at the origin of the great vessels. Visualized subclavian arteries well opacified and normal in appearance. Right carotid system: Right common carotid artery widely patent at its origin. There is a focal filling defect arising from the medial aspect of the right common carotid artery approximately 1.5-2 cm proximal to the  bifurcation (series 502, image 109). Intraluminal filling defects within the medial aspect of the right common carotid artery distal of ill, extending cephalad into the right carotid bifurcation. Filling defect extends up to the level of the origin of the right ICA. There is some extension into the proximal right ECA (series 502, image 121). Given the somewhat long segment nature of this finding, is small focal dissection is favored. There is mild to moderate stenosis of up to approximately 50% at the most narrow point. Distally, the right ICA is widely patent to the skullbase without other intimal irregularity, dissection, or stenosis. Left carotid system: Left common carotid artery patent from its origin to the bifurcation. Left ICA widely patent from the bifurcation to the skullbase. No dissection, stenosis, or vascular occlusion within the left carotid artery system. Vertebral arteries:Both vertebral arteries arise from the subclavian arteries. Left vertebral artery dominant. Vertebral arteries widely patent along their entire course without stenosis, dissection, or occlusion. Skeleton: No acute osseous abnormality. No worrisome lytic or blastic osseous lesions. Other neck: Visualized lungs are clear. Visualized superior mediastinum within normal limits. Thyroid gland normal. No adenopathy within the neck. No acute soft tissue abnormality. CTA HEAD Anterior circulation: The petrous, cavernous, and supraclinoid segments of the ICAs are widely patent bilaterally. A1 segments, anterior communicating artery, and anterior cerebral arteries well opacified. M1 segments widely patent without  stenosis or occlusion. No proximal M2 branch occlusion. Distal MCA branches fairly symmetric. Posterior circulation: Left vertebral artery dominant and widely patent to the vertebrobasilar junction. Diminutive right vertebral artery patent as well. Posterior inferior cerebral arteries patent. Basilar artery widely patent. Superior  cerebral arteries patent bilaterally. Both posterior cerebral arteries arise from the basilar artery and are well opacified to their distal aspects. Venous sinuses: Patent without evidence for venous sinus thrombosis. Anatomic variants: No anatomic variant.  No aneurysm. Delayed phase: Minimal leptomeningeal enhancement about the evolving right MCA territory infarcts. No other pathologic enhancement. IMPRESSION: 1. Focal intimal regularity at the medial aspect of the distal right common carotid artery extending into the right carotid bifurcation, likely reflecting a small focal dissection. Finding could certainly represent an embolic source for patient's right MCA territory infarcts. 2. Otherwise normal CTA of the head and neck. 3. Evolving ischemic right posterior MCA territory infarcts. Results were called by telephone at the time of interpretation on 11/12/2015 at 4:26 am to Dr. Cherylynn Ridges, who verbally acknowledged these results. Electronically Signed: By: Rise Mu M.D. On: 11/12/2015 04:30   Mr Brain Wo Contrast (neuro Protocol)  11/11/2015  CLINICAL DATA:  Transient left-sided weakness. History of anxiety, over the counter cold medications, and daily smoking history. Symptoms reportedly developed earlier today at 1715 hours. Patient states she is back to normal. EXAM: MRI HEAD WITHOUT CONTRAST TECHNIQUE: Multiplanar, multiecho pulse sequences of the brain and surrounding structures were obtained without intravenous contrast. COMPARISON:  None. FINDINGS: Slight motion.  Overall study diagnostic. Multifocal areas of acute infarction affect the RIGHT hemisphere. These are most notable in the RIGHT insula, RIGHT posterior temporal lobe, RIGHT anterior parietal lobe, RIGHT frontal and RIGHT posterior frontal lobe, and regional white matter. The largest confluent area affects the RIGHT parietal lobe, and could correlate with the patient's sensory symptoms. Within limits for detection on MR, these infarcts  are all are nonhemorrhagic. No left-sided or posterior circulation involvement. No hemorrhage, mass lesion, hydrocephalus, or extra-axial fluid. Slight premature for age atrophy. Minor white matter disease, nonspecific. Flow voids are maintained. No visible stenosis, dissection, or large vessel occlusion No midline abnormality. Extracranial soft tissues unremarkable. LEFT vertebral dominant. IMPRESSION: Multifocal areas of acute infarction affecting the RIGHT MCA territory. These appear nonhemorrhagic, but noncontrast CT should be obtained to confirm. Slight premature for age atrophy, minor white matter disease, nonspecific. These results were called by telephone at the time of interpretation on 11/11/2015 at 8:59 pm to Dr. Pricilla Loveless , who verbally acknowledged these results. Electronically Signed   By: Elsie Stain M.D.   On: 11/11/2015 21:01    Medications:  Scheduled: . aspirin  300 mg Rectal Daily   Or  . aspirin  325 mg Oral Daily  . enoxaparin (LOVENOX) injection  40 mg Subcutaneous QHS   Continuous: . sodium chloride 100 mL/hr at 11/11/15 2230   ZOX:[id number], ALPRAZolam, senna-docusate  Assessment/Plan:  Principal Problem:   Arterial ischemic stroke, MCA (middle cerebral artery), right, acute (HCC) Active Problems:   Acute right MCA stroke (HCC)    Acute stroke involving right MCA territory Neurology is following. Stroke workup is in progress. There is suspicion of a focal dissection in the distal right common carotid artery. Continue aspirin for now. PT/OT evaluation. HbA1c is pending. LDL is 56. Transthoracic echocardiogram is pending.  Anxiety state Likely due to the above. Continue Xanax as needed.  Hypokalemia Potassium repleted.  Mild transaminitis Stable. Etiology unclear. Outpatient workup.  DVT Prophylaxis: Lovenox  Code Status: Full code  Family Communication: Discussed with the patient and her husband  Disposition Plan: Await stroke workup to be  completed.    LOS: 1 day   [facility]  [facility] Pager [phone] 11/12/2015, 12:35 PM  If 7PM-7AM, please contact night-coverage at [email or web address], password [facility]

## 2015-11-12 NOTE — Progress Notes (Signed)
STROKE TEAM PROGRESS NOTE   HISTORY OF PRESENT ILLNESS Gina Ayala is an 40 y.o. female with hx of only smoking presented to the ER with an episode of L arm weakness and numbness and possible L facial droop that lasted 5 minutes earlier midday today. Upon arrival to the ER she had a NIHSS of zero, MRI was done that showed multifocal R MCA area strokes. Patient was not administered IV t-PA. She was admitted for further evaluation and treatment.   SUBJECTIVE (INTERVAL HISTORY) Her daughter is at the bedside.  Overall she feels her condition is gradually improving. She denies neck pain, fall,injury, chiropractic manipulation.No family h/o clots at young age, DVT or PEs   OBJECTIVE Temp:  [98 F (36.7 C)-99 F (37.2 C)] 99 F (37.2 C) (04/05 0847) Pulse Rate:  [63-92] 86 (04/05 0847) Cardiac Rhythm:  [-] Normal sinus rhythm (04/04 2258) Resp:  [15-20] 18 (04/05 0847) BP: (110-146)/(68-90) 127/76 mmHg (04/05 0847) SpO2:  [93 %-98 %] 98 % (04/05 0847) Weight:  [81.647 kg (180 lb)] 81.647 kg (180 lb) (04/04 1805)  CBC:  Recent Labs Lab 11/11/15 1900  11/11/15 2345 11/12/15 0646  WBC 12.3*  --  11.3* 9.3  NEUTROABS 7.3  --   --   --   HGB 16.2*  < > 15.5* 14.7  HCT 45.8  < > 44.7 42.2  MCV 95.8  --  93.9 96.1  PLT 420*  --  379 433*  < > = values in this interval not displayed.  Basic Metabolic Panel:  Recent Labs Lab 11/11/15 1900 11/11/15 1911 11/11/15 2345 11/12/15 0646  NA 140 140  --  141  K 3.3* 3.2*  --  3.0*  CL 105 102  --  106  CO2 24  --   --  22  GLUCOSE 128* 120*  --  105*  BUN 8 8  --  6  CREATININE 0.71 0.70 0.69 0.66  CALCIUM 9.4  --   --  8.7*    Lipid Panel:    Component Value Date/Time   CHOL 109 11/12/2015 0646   TRIG 106 11/12/2015 0646   HDL 32* 11/12/2015 0646   CHOLHDL 3.4 11/12/2015 0646   VLDL 21 11/12/2015 0646   LDLCALC 56 11/12/2015 0646   HgbA1c: No results found for: HGBA1C Urine Drug Screen: No results found for: LABOPIA,  COCAINSCRNUR, LABBENZ, AMPHETMU, THCU, LABBARB    IMAGING  Dg Chest 2 View 11/12/2015  No acute cardiopulmonary disease.   Ct Angio Head & Neck W/cm &/or Wo/cm 11/12/2015  1. Focal intimal irregularity at the medial aspect of the distal right common carotid artery extending into the right carotid bifurcation, likely reflecting a small focal dissection. Finding could certainly represent an embolic source for patient's right MCA territory infarcts. 2. Otherwise normal CTA of the head and neck. 3. Evolving ischemic right posterior MCA territory infarcts.   Mr Brain Wo Contrast (neuro Protocol) 11/11/2015   Multifocal areas of acute infarction affecting the RIGHT MCA territory. These appear nonhemorrhagic, but noncontrast CT should be obtained to confirm. Slight premature for age atrophy, minor white matter disease, nonspecific.    PHYSICAL EXAM Pleasant middle aged Caucasian lady not in distress. . Afebrile. Head is nontraumatic. Neck is supple without bruit.    Cardiac exam no murmur or gallop. Lungs are clear to auscultation. Distal pulses are well felt. Neurological Exam :  Awake alert oriented x 3 normal speech and language. No face asymmetry. Tongue midline. No drift.  Mild diminished fine finger movements on left. Orbits right over left upper extremity. Mild left grip weak.. Diminished left hand  sensation . Normal coordination. ASSESSMENT/PLAN Ms. Gina Ayala is a 40 y.o. female with history of smoking presenting with L arm weakness and numbness with possible L facial droop. She did not receive IV t-PA.   Stroke:  Non-dominant  Multifocal R MCA territory infarcts embolic secondary to unknown source  Resultant  Mild left hand weakness and numbness  MRI  Multifocal R MCA territory infarcts  CTA head and neck R CCA with small focal dissection. evolving R MCA infarct. Otherwise normal. 2D Echo  Left ventricle: The cavity size was normal. Systolic function was  normal. The estimated  ejection fraction was in the range of 55%  to 60%. Wall motion was normal; there were no regional wall  motion abnormalities. Left ventricular diastolic function   parameters were normal.  TEE to look for embolic source. Arranged with [facility] for tomorrow.  If positive for PFO (patent foramen ovale), check bilateral lower extremity venous dopplers to rule out DVT as possible source of stroke. (I have made patient NPO after midnight tonight).   LDL 56  HgbA1c pending  Check  Hypercoagulable panel    Lovenox 40 mg sq daily for VTE prophylaxis  Diet Heart Room service appropriate?: Yes; Fluid consistency:: Thin  No antithrombotic prior to admission, now on aspirin 325 mg daily  Patient counseled to be compliant with her antithrombotic medications  Ongoing aggressive stroke risk factor management  Therapy recommendations:  No PT  Disposition:  Return home  Other Stroke Risk Factors  Cigarette smoker, advised to stop smoking  Overweight, Body mass index is 29.07 kg/(m^2).   Other Active Problems  Hypokalemia, 3.0  Leukocytosis, 12.3  Hospital day # 1  I have personally examined this patient, reviewed notes, independently viewed imaging studies, participated in medical decision making and plan of care. I have made any additions or clarifications directly to the above note. Agree with note above. She presented with left UE weakness and numbness from right MCA embolic infarct from proximal right ICA clot etiology unclear possibly underlying small dissection.She is at risk for recurrent strokes, TIAs,neurological worsening and needs shorterm anticoagulation to prevent distal emboli from left ICA clot.I discussed risk of hemorrhage from anticoagulation, risk of hemorrhagic transformation versus recurrent strokes.This is a complex situation requiring medical decision making of high complexity.Recommend Eliquis for 3 months and repeat CT angio in 3 months  and change to aspirin after that if clot resolved.Check TEE and hypercaog labs  Delia HeadyPramod Sethi, MD Medical Director [facility] Pager: [phone] 11/12/2015 6:31 PM     To contact Stroke Continuity provider, please refer to [email or web address]. After hours, contact General Neurology

## 2015-11-12 NOTE — Progress Notes (Signed)
OT Cancellation Note  Patient Details Name: Gina Ayala MRN: [id number] DOB: 1975-08-31   Cancelled Treatment:    Reason Eval/Treat Not Completed: OT screened, no needs identified, will sign off. Pt observed to be ambulating in hallway with PT; performing DGI without difficulty. PT reports pt was able to complete self care task independently. Pt reports she feels all her symptoms have resolved; no visual changes or UE deficits. No further acute OT needs identified; signing off at this time. Please re-consult if needs change. Thank you for this referral.  Gaye AlkenBailey A Itai Barbian M.S., OTR/L Pager: [phone]  11/12/2015, 10:07 AM

## 2015-11-12 NOTE — Progress Notes (Addendum)
Patient educated on npo for tee tomorrow AM, patient states she had no knowledge of procedure being done. Patient agreed to npo diet at midnight. Will continue to monitor closely. She also states she does not react well to anesthetics, reports very bad itching with anesthetics

## 2015-11-12 NOTE — Progress Notes (Signed)
  Echocardiogram 2D Echocardiogram has been performed.  Gina SavoyCasey N [name] 11/12/2015, 2:35 PM

## 2015-11-12 NOTE — Evaluation (Signed)
Physical Therapy Evaluation and Discharge Patient Details Name: Gina RubensteinChrista Ayala Ayala MRN: [id number] DOB: 1976/06/08 Today's Date: 11/12/2015   History of Present Illness  Gina Ayala is a 40 y.o. female with history of tobacco abuse at around 5 PM this evening suddenly lost consciousness while she was with her husband. On waking up immediately patient found that she was unable to move her left upper extremity and also the husband noticed left facial droop. By the time patient reached ER patient's focal deficits and resolved. Denies any difficulty seeing speaking or swallowing. MRI reveals Multifocal areas of acute infarction affecting the RIGHT MCA  Clinical Impression  Pt admitted with above. Pt functioning near baseline just fatigued. Pt with good support via husband and family. Pt safe to d/c home with family. No DME needed. Pt with no further skilled acute PT needs at this time. PT SIGNING OFF. Please re-consult if needed in future.    Follow Up Recommendations No PT follow up;Supervision/Assistance - 24 hour (initially for a few days)    Equipment Recommendations  None recommended by PT    Recommendations for Other Services       Precautions / Restrictions Precautions Precautions: None Restrictions Weight Bearing Restrictions: No      Mobility  Bed Mobility Overal bed mobility: Independent             General bed mobility comments: no difficulties  Transfers Overall transfer level: Modified independent Equipment used: None             General transfer comment: pushed up with UEs on bed, no difficulties  Ambulation/Gait Ambulation/Gait assistance: Supervision Ambulation Distance (Feet): 300 Feet Assistive device: None Gait Pattern/deviations: WFL(Within Functional Limits) Gait velocity: wfl Gait velocity interpretation: at or above normal speed for age/gender General Gait Details: no episodes of LOB or instability or report of vision deficits.Supervision  due to first time up since admission  Stairs Stairs: Yes Stairs assistance: Min guard Stair Management: One rail Right;No rails Number of Stairs: 13 General stair comments: completegd 2 stairs without railing and 13 steps with R handrail to mimic home set up  Wheelchair Mobility    Modified Rankin (Stroke Patients Only) Modified Rankin (Stroke Patients Only) Pre-Morbid Rankin Score: No symptoms Modified Rankin: No significant disability     Balance                                 Standardized Balance Assessment Standardized Balance Assessment : Dynamic Gait Index   Dynamic Gait Index Level Surface: Normal Change in Gait Speed: Normal Gait with Horizontal Head Turns: Normal Gait with Vertical Head Turns: Normal Gait and Pivot Turn: Normal Step Over Obstacle: Normal Step Around Obstacles: Normal Steps: Mild Impairment Total Score: 23       Pertinent Vitals/Pain Pain Assessment: No/denies pain    Home Living Family/patient expects to be discharged to:: Private residence Living Arrangements: Spouse/significant other Available Help at Discharge: Family;Available PRN/intermittently Type of Home: House Home Access: Stairs to enter Entrance Stairs-Rails: None Entrance Stairs-Number of Steps: 2 Home Layout: Two level Home Equipment: None      Prior Function Level of Independence: Independent         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     Hand Dominance   Dominant Hand: Right    Extremity/Trunk Assessment   Upper Extremity Assessment: Overall WFL for tasks assessed           Lower  Extremity Assessment: Overall WFL for tasks assessed      Cervical / Trunk Assessment: Normal  Communication   Communication: No difficulties  Cognition Arousal/Alertness: Awake/alert Behavior During Therapy: WFL for tasks assessed/performed Overall Cognitive Status: Within Functional Limits for tasks assessed                      General Comments  General comments (skin integrity, edema, etc.): discussed healthy living habits and stroke prevention    Exercises        Assessment/Plan    PT Assessment Patent does not need any further PT services  PT Diagnosis Generalized weakness   PT Problem List    PT Treatment Interventions     PT Goals (Current goals can be found in the Care Plan section) Acute Rehab PT Goals Patient Stated Goal: home asap PT Goal Formulation: All assessment and education complete, DC therapy    Frequency     Barriers to discharge        Co-evaluation               End of Session   Activity Tolerance: Patient tolerated treatment well Patient left: in chair;with call bell/phone within reach;with family/visitor present Nurse Communication: Mobility status         Time: 1610-9604 PT Time Calculation (min) (ACUTE ONLY): 32 min   Charges:   PT Evaluation $PT Eval Low Complexity: 1 Procedure PT Treatments $Neuromuscular Re-education: 8-22 mins   PT G CodesMarcene Brawn 11/12/2015, 11:21 AM   Lewis Shock, PT, DPT Pager #: [phone] Office #: [phone]

## 2015-11-12 NOTE — Care Management Note (Signed)
Case Management Note  Patient Details  Name: Gina Ayala MRN: [id number] Date of Birth: 1976-06-11  Subjective/Objective:                    Action/Plan: Patient was admitted with CVA.  Lives at home with significant other.  Patient is currently listed as self-pay and is being followed by Fernande BoydenAdam Byrd in Financial Counseling.  Will follow for discharge needs pending PT/OT evals and physician orders.  Expected Discharge Date:                  Expected Discharge Plan:     In-House Referral:     Discharge planning Services     Post Acute Care Choice:    Choice offered to:     DME Arranged:    DME Agency:     HH Arranged:    HH Agency:     Status of Service:  In process, will continue to follow  Medicare Important Message Given:    Date Medicare IM Given:    Medicare IM give by:    Date Additional Medicare IM Given:    Additional Medicare Important Message give by:     If discussed at Long Length of Stay Meetings, dates discussed:    Additional Comments:  Anda KraftRobarge, Dallana Mavity C, RN 11/12/2015, 9:01 AM [phone]

## 2015-11-13 LAB — PROTEIN C ACTIVITY: Protein C Activity: 150 % (ref 73–180)

## 2015-11-13 LAB — PROTEIN S, TOTAL: Protein S Ag, Total: 115 % (ref 60–150)

## 2015-11-13 LAB — CBC
HCT: 40.4 % (ref 36.0–46.0)
Hemoglobin: 14 g/dL (ref 12.0–15.0)
MCH: 33.5 pg (ref 26.0–34.0)
MCHC: 34.7 g/dL (ref 30.0–36.0)
MCV: 96.7 fL (ref 78.0–100.0)
PLATELETS: ADEQUATE 10*3/uL (ref 150–400)
RBC: 4.18 MIL/uL (ref 3.87–5.11)
RDW: 13.4 % (ref 11.5–15.5)
WBC: 10.6 10*3/uL — ABNORMAL HIGH (ref 4.0–10.5)

## 2015-11-13 LAB — COMPREHENSIVE METABOLIC PANEL
ALT: 61 U/L — ABNORMAL HIGH (ref 14–54)
ANION GAP: 12 (ref 5–15)
AST: 50 U/L — ABNORMAL HIGH (ref 15–41)
Albumin: 3 g/dL — ABNORMAL LOW (ref 3.5–5.0)
Alkaline Phosphatase: 45 U/L (ref 38–126)
BUN: 5 mg/dL — ABNORMAL LOW (ref 6–20)
CHLORIDE: 113 mmol/L — AB (ref 101–111)
CO2: 19 mmol/L — AB (ref 22–32)
Calcium: 8.5 mg/dL — ABNORMAL LOW (ref 8.9–10.3)
Creatinine, Ser: 0.58 mg/dL (ref 0.44–1.00)
GFR calc non Af Amer: >60 mL/min (ref >60)
Glucose, Bld: 103 mg/dL — ABNORMAL HIGH (ref 65–99)
POTASSIUM: 3.8 mmol/L (ref 3.5–5.1)
SODIUM: 144 mmol/L (ref 135–145)
Total Bilirubin: 0.5 mg/dL (ref 0.3–1.2)
Total Protein: 5.4 g/dL — ABNORMAL LOW (ref 6.5–8.1)

## 2015-11-13 LAB — HEPATITIS PANEL, ACUTE
HEP A IGM: NEGATIVE
HEP B S AG: NEGATIVE
Hep B C IgM: NEGATIVE

## 2015-11-13 LAB — HEMOGLOBIN A1C
Hgb A1c MFr Bld: 5.5 % (ref 4.8–5.6)
MEAN PLASMA GLUCOSE: 111 mg/dL

## 2015-11-13 LAB — LUPUS ANTICOAGULANT PANEL
DRVVT: 50.7 s — AB (ref 0.0–44.0)
PTT Lupus Anticoagulant: 31.4 s (ref 0.0–43.6)

## 2015-11-13 LAB — PROTEIN C, TOTAL: PROTEIN C, TOTAL: 115 % (ref 60–150)

## 2015-11-13 LAB — HOMOCYSTEINE: Homocysteine: 10.3 umol/L (ref 0.0–15.0)

## 2015-11-13 LAB — DRVVT MIX: dRVVT Mix: 42.7 s (ref 0.0–44.0)

## 2015-11-13 LAB — PROTEIN S ACTIVITY: PROTEIN S ACTIVITY: 104 % (ref 63–140)

## 2015-11-13 MED ORDER — APIXABAN 5 MG PO TABS: 5.0000 mg | ORAL_TABLET | Freq: Two times a day (BID) | ORAL | Status: DC

## 2015-11-13 MED ORDER — ALPRAZOLAM 0.5 MG PO TABS: 0.5000 mg | ORAL_TABLET | Freq: Two times a day (BID) | ORAL | Status: DC | PRN

## 2015-11-13 NOTE — Care Management Note (Signed)
Case Management Note  Patient Details  Name: Gina Ayala MRN: [id number] Date of Birth: August 16, 1975  Subjective/Objective:                    Action/Plan: Patient does not have a PCP or insurance. CM met with the patient and asked her about going to the [facility]. Patient was in agreement. CM able to obtain pt an appt at the [facility] (who is seeing the overflow for [facility]). Information placed on the AVS. Patient also educated on using the pharmacy at the [facility] to be able to obtain her meds at an affordable cost. Patient also being discharged on Eliquis. CM provided the patient with the 30 day free card and the $10/month co pay card. CM will continue to follow for further d/c needs.   Expected Discharge Date:                  Expected Discharge Plan:     In-House Referral:     Discharge planning Services     Post Acute Care Choice:    Choice offered to:     DME Arranged:    DME Agency:     HH Arranged:    HH Agency:     Status of Service:  In process, will continue to follow  Medicare Important Message Given:    Date Medicare IM Given:    Medicare IM give by:    Date Additional Medicare IM Given:    Additional Medicare Important Message give by:     If discussed at [place] of Stay Meetings, dates discussed:    Additional Comments:  Pollie Friar, RN 11/13/2015, 12:05 PM

## 2015-11-13 NOTE — Progress Notes (Signed)
[facility] PROGRESS NOTE  Gina Ayala ZOX:[id number] DOB: December 02, 1975 DOA: 11/11/2015  PCP: No PCP Per Patient  Brief HPI: 40 year old Caucasian female with a past medical history of tobacco abuse and no other health problems except for anxiety, presented with left-sided weakness, facial droop. Symptoms resolved by the time the patient arrived at the emergency department. Patient was hospitalized for further management.  Past medical history:  Past Medical History  Diagnosis Date  . Anxiety     Consultants: Neurology  Procedures:  Transthoracic echocardiogram Study Conclusions - Left ventricle: The cavity size was normal. Systolic function was normal. The estimated ejection fraction was in the range of 55% to 60%. Wall motion was normal; there were no regional wall motion abnormalities. Left ventricular diastolic function parameters were normal.   Antibiotics: None  Subjective: Patient Is in better spirits today. Waiting to undergo TEE. Denies any weakness in any one side of her body. No headaches.  Objective: Vital Signs  Filed Vitals:   11/12/15 2039 11/13/15 0220 11/13/15 0533 11/13/15 0918  BP: 138/76 118/61 134/87 135/95  Pulse: 68 63 73 71  Temp: 98.5 F (36.9 C) 98.1 F (36.7 C) 97.6 F (36.4 C) 97.5 F (36.4 C)  TempSrc: Oral  Oral Oral  Resp: Height:      Weight:      SpO2: 98% 100% 98% 96%   No intake or output data in the 24 hours ending 11/13/15 1326 Filed Weights   11/11/15 1805  Weight: 81.647 kg (180 lb)    General appearance: alert, cooperative, appears stated age and no distress Resp: clear to auscultation bilaterally Cardio: regular rate and rhythm, S1, S2 normal, no murmur, click, rub or gallop GI: soft, non-tender; bowel sounds normal; no masses,  no organomegaly Neurologic: Awake and alert. Oriented 3. No facial asymmetry. Tongue is midline. Motor strength equal bilateral upper and lower extremities.  Lab  Results:  Basic Metabolic Panel:  Recent Labs Lab 11/11/15 1900 11/11/15 1911 11/11/15 2345 11/12/15 0646 11/13/15 0532  NA 140 140  --  141 144  K 3.3* 3.2*  --  3.0* 3.8  CL 105 102  --  106 113*  CO2 24  --   --  22 19*  GLUCOSE 128* 120*  --  105* 103*  BUN 8 8  --  6 5*  CREATININE 0.71 0.70 0.69 0.66 0.58  CALCIUM 9.4  --   --  8.7* 8.5*   Liver Function Tests:  Recent Labs Lab 11/11/15 1900 11/12/15 0646 11/13/15 0532  AST 54* 51* 50*  ALT 67* 64* 61*  ALKPHOS 58 51 45  BILITOT 0.7 0.4 0.5  PROT 6.8 6.0* 5.4*  ALBUMIN 3.8 3.3* 3.0*   CBC:  Recent Labs Lab 11/11/15 1900 11/11/15 1911 11/11/15 2345 11/12/15 0646 11/13/15 0532  WBC 12.3*  --  11.3* 9.3 10.6*  NEUTROABS 7.3  --   --   --   --   HGB 16.2* 15.6* 15.5* 14.7 14.0  HCT 45.8 46.0 44.7 42.2 40.4  MCV 95.8  --  93.9 96.1 96.7  PLT 420*  --  379 433* PLATELET CLUMPS NOTED ON SMEAR, COUNT APPEARS ADEQUATE     Studies/Results: Ct Angio Head W/cm &/or Wo Cm  11/12/2015  ADDENDUM REPORT: 11/12/2015 08:02 ADDENDUM: There is a dictation error in the impression portion of the initially dictated report. Impression should state: Focal intimal irregularity (not regularity). Electronically Signed   By: Rise Mu  M.D.   On: 11/12/2015 08:02  11/12/2015  CLINICAL DATA:  Initial evaluation for acute stroke. EXAM: CT ANGIOGRAPHY HEAD AND NECK TECHNIQUE: Multidetector CT imaging of the head and neck was performed using the standard protocol during bolus administration of intravenous contrast. Multiplanar CT image reconstructions and MIPs were obtained to evaluate the vascular anatomy. Carotid stenosis measurements (when applicable) are obtained utilizing NASCET criteria, using the distal internal carotid diameter as the denominator. CONTRAST:  50 cc of Isovue 370. COMPARISON:  Prior brain MRI from 11/11/2015 FINDINGS: CT HEAD Subtle evolving areas of acute right MCA territory infarcts are seen, most evident  within the right parietal lobe. These are better evaluated on previous brain MRI. No significant mass effect or associated hemorrhage. No other acute large vessel territory infarct. Deep gray nuclei maintained. No acute intracranial hemorrhage. No mass lesion or midline shift. No mass effect. No hydrocephalus. No extra-axial fluid collection. Scalp soft tissues within normal limits. No acute abnormality about the orbits. Paranasal sinuses are clear.  No mastoid effusion. Calvarium intact. CTA NECK Aortic arch: Visualized aortic arch of normal caliber with normal branch pattern. No high-grade stenosis at the origin of the great vessels. Visualized subclavian arteries well opacified and normal in appearance. Right carotid system: Right common carotid artery widely patent at its origin. There is a focal filling defect arising from the medial aspect of the right common carotid artery approximately 1.5-2 cm proximal to the bifurcation (series 502, image 109). Intraluminal filling defects within the medial aspect of the right common carotid artery distal of ill, extending cephalad into the right carotid bifurcation. Filling defect extends up to the level of the origin of the right ICA. There is some extension into the proximal right ECA (series 502, image 121). Given the somewhat long segment nature of this finding, is small focal dissection is favored. There is mild to moderate stenosis of up to approximately 50% at the most narrow point. Distally, the right ICA is widely patent to the skullbase without other intimal irregularity, dissection, or stenosis. Left carotid system: Left common carotid artery patent from its origin to the bifurcation. Left ICA widely patent from the bifurcation to the skullbase. No dissection, stenosis, or vascular occlusion within the left carotid artery system. Vertebral arteries:Both vertebral arteries arise from the subclavian arteries. Left vertebral artery dominant. Vertebral arteries  widely patent along their entire course without stenosis, dissection, or occlusion. Skeleton: No acute osseous abnormality. No worrisome lytic or blastic osseous lesions. Other neck: Visualized lungs are clear. Visualized superior mediastinum within normal limits. Thyroid gland normal. No adenopathy within the neck. No acute soft tissue abnormality. CTA HEAD Anterior circulation: The petrous, cavernous, and supraclinoid segments of the ICAs are widely patent bilaterally. A1 segments, anterior communicating artery, and anterior cerebral arteries well opacified. M1 segments widely patent without stenosis or occlusion. No proximal M2 branch occlusion. Distal MCA branches fairly symmetric. Posterior circulation: Left vertebral artery dominant and widely patent to the vertebrobasilar junction. Diminutive right vertebral artery patent as well. Posterior inferior cerebral arteries patent. Basilar artery widely patent. Superior cerebral arteries patent bilaterally. Both posterior cerebral arteries arise from the basilar artery and are well opacified to their distal aspects. Venous sinuses: Patent without evidence for venous sinus thrombosis. Anatomic variants: No anatomic variant.  No aneurysm. Delayed phase: Minimal leptomeningeal enhancement about the evolving right MCA territory infarcts. No other pathologic enhancement. IMPRESSION: 1. Focal intimal regularity at the medial aspect of the distal right common carotid artery extending into the right  carotid bifurcation, likely reflecting a small focal dissection. Finding could certainly represent an embolic source for patient's right MCA territory infarcts. 2. Otherwise normal CTA of the head and neck. 3. Evolving ischemic right posterior MCA territory infarcts. Results were called by telephone at the time of interpretation on 11/12/2015 at 4:26 am to Dr. Cherylynn Ridges, who verbally acknowledged these results. Electronically Signed: By: Rise Mu M.D. On: 11/12/2015  04:30   Dg Chest 2 View  11/12/2015  CLINICAL DATA:  Stroke. EXAM: CHEST  2 VIEW COMPARISON:  No recent prior. FINDINGS: Mediastinum hilar structures normal. Lungs are clear. Heart size normal. No pleural effusion or pneumothorax. Thoracic spine scoliosis. IMPRESSION: No acute cardiopulmonary disease. Electronically Signed   By: Maisie Fus  Register   On: 11/12/2015 07:24   Ct Angio Neck W/cm &/or Wo/cm  11/12/2015  ADDENDUM REPORT: 11/12/2015 08:02 ADDENDUM: There is a dictation error in the impression portion of the initially dictated report. Impression should state: Focal intimal irregularity (not regularity). Electronically Signed   By: Rise Mu M.D.   On: 11/12/2015 08:02  11/12/2015  CLINICAL DATA:  Initial evaluation for acute stroke. EXAM: CT ANGIOGRAPHY HEAD AND NECK TECHNIQUE: Multidetector CT imaging of the head and neck was performed using the standard protocol during bolus administration of intravenous contrast. Multiplanar CT image reconstructions and MIPs were obtained to evaluate the vascular anatomy. Carotid stenosis measurements (when applicable) are obtained utilizing NASCET criteria, using the distal internal carotid diameter as the denominator. CONTRAST:  50 cc of Isovue 370. COMPARISON:  Prior brain MRI from 11/11/2015 FINDINGS: CT HEAD Subtle evolving areas of acute right MCA territory infarcts are seen, most evident within the right parietal lobe. These are better evaluated on previous brain MRI. No significant mass effect or associated hemorrhage. No other acute large vessel territory infarct. Deep gray nuclei maintained. No acute intracranial hemorrhage. No mass lesion or midline shift. No mass effect. No hydrocephalus. No extra-axial fluid collection. Scalp soft tissues within normal limits. No acute abnormality about the orbits. Paranasal sinuses are clear.  No mastoid effusion. Calvarium intact. CTA NECK Aortic arch: Visualized aortic arch of normal caliber with normal branch  pattern. No high-grade stenosis at the origin of the great vessels. Visualized subclavian arteries well opacified and normal in appearance. Right carotid system: Right common carotid artery widely patent at its origin. There is a focal filling defect arising from the medial aspect of the right common carotid artery approximately 1.5-2 cm proximal to the bifurcation (series 502, image 109). Intraluminal filling defects within the medial aspect of the right common carotid artery distal of ill, extending cephalad into the right carotid bifurcation. Filling defect extends up to the level of the origin of the right ICA. There is some extension into the proximal right ECA (series 502, image 121). Given the somewhat long segment nature of this finding, is small focal dissection is favored. There is mild to moderate stenosis of up to approximately 50% at the most narrow point. Distally, the right ICA is widely patent to the skullbase without other intimal irregularity, dissection, or stenosis. Left carotid system: Left common carotid artery patent from its origin to the bifurcation. Left ICA widely patent from the bifurcation to the skullbase. No dissection, stenosis, or vascular occlusion within the left carotid artery system. Vertebral arteries:Both vertebral arteries arise from the subclavian arteries. Left vertebral artery dominant. Vertebral arteries widely patent along their entire course without stenosis, dissection, or occlusion. Skeleton: No acute osseous abnormality. No worrisome lytic or blastic  osseous lesions. Other neck: Visualized lungs are clear. Visualized superior mediastinum within normal limits. Thyroid gland normal. No adenopathy within the neck. No acute soft tissue abnormality. CTA HEAD Anterior circulation: The petrous, cavernous, and supraclinoid segments of the ICAs are widely patent bilaterally. A1 segments, anterior communicating artery, and anterior cerebral arteries well opacified. M1 segments  widely patent without stenosis or occlusion. No proximal M2 branch occlusion. Distal MCA branches fairly symmetric. Posterior circulation: Left vertebral artery dominant and widely patent to the vertebrobasilar junction. Diminutive right vertebral artery patent as well. Posterior inferior cerebral arteries patent. Basilar artery widely patent. Superior cerebral arteries patent bilaterally. Both posterior cerebral arteries arise from the basilar artery and are well opacified to their distal aspects. Venous sinuses: Patent without evidence for venous sinus thrombosis. Anatomic variants: No anatomic variant.  No aneurysm. Delayed phase: Minimal leptomeningeal enhancement about the evolving right MCA territory infarcts. No other pathologic enhancement. IMPRESSION: 1. Focal intimal regularity at the medial aspect of the distal right common carotid artery extending into the right carotid bifurcation, likely reflecting a small focal dissection. Finding could certainly represent an embolic source for patient's right MCA territory infarcts. 2. Otherwise normal CTA of the head and neck. 3. Evolving ischemic right posterior MCA territory infarcts. Results were called by telephone at the time of interpretation on 11/12/2015 at 4:26 am to Dr. Cherylynn Ridges, who verbally acknowledged these results. Electronically Signed: By: Rise Mu M.D. On: 11/12/2015 04:30   Mr Brain Wo Contrast (neuro Protocol)  11/11/2015  CLINICAL DATA:  Transient left-sided weakness. History of anxiety, over the counter cold medications, and daily smoking history. Symptoms reportedly developed earlier today at 1715 hours. Patient states she is back to normal. EXAM: MRI HEAD WITHOUT CONTRAST TECHNIQUE: Multiplanar, multiecho pulse sequences of the brain and surrounding structures were obtained without intravenous contrast. COMPARISON:  None. FINDINGS: Slight motion.  Overall study diagnostic. Multifocal areas of acute infarction affect the RIGHT  hemisphere. These are most notable in the RIGHT insula, RIGHT posterior temporal lobe, RIGHT anterior parietal lobe, RIGHT frontal and RIGHT posterior frontal lobe, and regional white matter. The largest confluent area affects the RIGHT parietal lobe, and could correlate with the patient's sensory symptoms. Within limits for detection on MR, these infarcts are all are nonhemorrhagic. No left-sided or posterior circulation involvement. No hemorrhage, mass lesion, hydrocephalus, or extra-axial fluid. Slight premature for age atrophy. Minor white matter disease, nonspecific. Flow voids are maintained. No visible stenosis, dissection, or large vessel occlusion No midline abnormality. Extracranial soft tissues unremarkable. LEFT vertebral dominant. IMPRESSION: Multifocal areas of acute infarction affecting the RIGHT MCA territory. These appear nonhemorrhagic, but noncontrast CT should be obtained to confirm. Slight premature for age atrophy, minor white matter disease, nonspecific. These results were called by telephone at the time of interpretation on 11/11/2015 at 8:59 pm to Dr. Pricilla Loveless , who verbally acknowledged these results. Electronically Signed   By: Elsie Stain M.D.   On: 11/11/2015 21:01    Medications:  Scheduled: . aspirin  300 mg Rectal Daily   Or  . aspirin  325 mg Oral Daily  . enoxaparin (LOVENOX) injection  40 mg Subcutaneous QHS   Continuous:   QMV:[id number], ALPRAZolam, senna-docusate  Assessment/Plan:  Principal Problem:   Arterial ischemic stroke, MCA (middle cerebral artery), right, acute (HCC) Active Problems:   Acute right MCA stroke (HCC)    Acute stroke involving right MCA territory Neurology is following. Stroke workup is in progress. There was suspicion of a focal dissection  in the distal right common carotid artery. However, neurology thinks that this is a clot. TEE has been recommended and patient is waiting on that test. Echocardiogram report as above. LDL  is 56. HbA1c is 5.5. PT and OT evaluation is ongoing. Anticoagulation for 3 months has been recommended by neurology.   Anxiety state Likely due to the above. Continue Xanax as needed.  Hypokalemia Potassium repleted. Mild metabolic acidosis noted. Repeat labs tomorrow.  Mild transaminitis Stable. Etiology unclear. Hepatitis panel is negative.  DVT Prophylaxis: Lovenox    Code Status: Full code  Family Communication: Discussed with the patient and her husband  Disposition Plan: Await stroke workup to be completed.    LOS: 2 days   [facility]  [facility] Pager [phone] 11/13/2015, 1:26 PM  If 7PM-7AM, please contact night-coverage at [email or web address], password [facility]

## 2015-11-13 NOTE — Progress Notes (Signed)
STROKE TEAM PROGRESS NOTE   HISTORY OF PRESENT ILLNESS Gina Ayala is an 40 y.o. female with hx of only smoking presented to the ER with an episode of L arm weakness and numbness and possible L facial droop that lasted 5 minutes earlier midday today. Upon arrival to the ER she had a NIHSS of zero, MRI was done that showed multifocal R MCA area strokes. Patient was not administered IV t-PA. She was admitted for further evaluation and treatment.   SUBJECTIVE (INTERVAL HISTORY) Her mother is at the bedside and has lots of questions about TEE which I addressed..  Overall she feels her condition is gradually improving.    OBJECTIVE Temp:  [97.5 F (36.4 C)-98.9 F (37.2 C)] 97.9 F (36.6 C) (04/06 1410) Pulse Rate:  [63-74] 69 (04/06 1410) Cardiac Rhythm:  [-] Sinus bradycardia (04/06 0700) Resp:  [18] 18 (04/06 1410) BP: (116-140)/(61-95) 140/92 mmHg (04/06 1410) SpO2:  [96 %-100 %] 98 % (04/06 1410)  CBC:  Recent Labs Lab 11/11/15 1900  11/12/15 0646 11/13/15 0532  WBC 12.3*  < > 9.3 10.6*  NEUTROABS 7.3  --   --   --   HGB 16.2*  < > 14.7 14.0  HCT 45.8  < > 42.2 40.4  MCV 95.8  < > 96.1 96.7  PLT 420*  < > 433* PLATELET CLUMPS NOTED ON SMEAR, COUNT APPEARS ADEQUATE  < > = values in this interval not displayed.  Basic Metabolic Panel:   Recent Labs Lab 11/12/15 0646 11/13/15 0532  NA 141 144  K 3.0* 3.8  CL 106 113*  CO2 22 19*  GLUCOSE 105* 103*  BUN 6 5*  CREATININE 0.66 0.58  CALCIUM 8.7* 8.5*    Lipid Panel:     Component Value Date/Time   CHOL 109 11/12/2015 0646   TRIG 106 11/12/2015 0646   HDL 32* 11/12/2015 0646   CHOLHDL 3.4 11/12/2015 0646   VLDL 21 11/12/2015 0646   LDLCALC 56 11/12/2015 0646   HgbA1c:  Lab Results  Component Value Date   HGBA1C 5.5 11/12/2015   Urine Drug Screen: No results found for: LABOPIA, COCAINSCRNUR, LABBENZ, AMPHETMU, THCU, LABBARB    IMAGING  Dg Chest 2 View 11/12/2015  No acute cardiopulmonary disease.    Ct Angio Head & Neck W/cm &/or Wo/cm 11/12/2015  1. Focal intimal irregularity at the medial aspect of the distal right common carotid artery extending into the right carotid bifurcation, likely reflecting a small focal dissection. Finding could certainly represent an embolic source for patient's right MCA territory infarcts. 2. Otherwise normal CTA of the head and neck. 3. Evolving ischemic right posterior MCA territory infarcts.   Mr Brain Wo Contrast (neuro Protocol) 11/11/2015   Multifocal areas of acute infarction affecting the RIGHT MCA territory. These appear nonhemorrhagic, but noncontrast CT should be obtained to confirm. Slight premature for age atrophy, minor white matter disease, nonspecific.    PHYSICAL EXAM Pleasant middle aged Caucasian lady not in distress. . Afebrile. Head is nontraumatic. Neck is supple without bruit.    Cardiac exam no murmur or gallop. Lungs are clear to auscultation. Distal pulses are well felt. Neurological Exam :  Awake alert oriented x 3 normal speech and language. No face asymmetry. Tongue midline. No drift. Mild diminished fine finger movements on left. Orbits right over left upper extremity. Mild left grip weak.. Diminished left hand  sensation . Normal coordination. ASSESSMENT/PLAN Gina Ayala is a 40 y.o. female with history of  smoking presenting with L arm weakness and numbness with possible L facial droop. She did not receive IV t-PA.   Stroke:  Non-dominant  Multifocal R MCA territory infarcts embolic secondary to unknown source  Resultant  Mild left hand weakness and numbness  MRI  Multifocal R MCA territory infarcts  CTA head and neck R CCA with small focal dissection. evolving R MCA infarct. Otherwise normal. 2D Echo  Left ventricle: The cavity size was normal. Systolic function was  normal. The estimated ejection fraction was in the range of 55%  to 60%. Wall motion was normal; there were no regional wall  motion abnormalities.  Left ventricular diastolic function   parameters were normal.  TEE to look for embolic source. Arranged with [facility] for tomorrow.  If positive for PFO (patent foramen ovale), check bilateral lower extremity venous dopplers to rule out DVT as possible source of stroke. (I have made patient NPO after midnight tonight).   LDL 56  HgbA1c 5.5    Hypercoagulable panel  pending  Lovenox 40 mg sq daily for VTE prophylaxis Diet NPO time specified Diet Heart Room service appropriate?: Yes; Fluid consistency:: Thin  No antithrombotic prior to admission, now on aspirin 325 mg daily  Patient counseled to be compliant with her antithrombotic medications  Ongoing aggressive stroke risk factor management  Therapy recommendations:  No PT  Disposition:  Return home  Other Stroke Risk Factors  Cigarette smoker, advised to stop smoking  Overweight, Body mass index is 29.07 kg/(m^2).   Other Active Problems  Hypokalemia, 3.0  Leukocytosis, 12.3  Hospital day # 2  I have personally examined this patient, reviewed notes, independently viewed imaging studies, participated in medical decision making and plan of care. I have made any additions or clarifications directly to the above note. Agree with note above. She presented with left UE weakness and numbness from right MCA embolic infarct from proximal right ICA clot etiology unclear possibly underlying small dissection.She is at risk for recurrent strokes, TIAs,neurological worsening and needs shorterm anticoagulation to prevent distal emboli from left ICA clot.I discussed risk of hemorrhage from anticoagulation, risk of hemorrhagic transformation versus recurrent strokes. .Check TEE  Scheduled 4/7/17and hypercaog labs  Delia Heady, MD Medical Director [facility] Pager: [phone] 11/13/2015 3:37 PM     To contact Stroke Continuity provider, please refer to [email or web address]. After hours, contact  General Neurology

## 2015-11-13 NOTE — Progress Notes (Signed)
[facility] has been requested to perform a transesophageal echocardiogram on 11/14/15 at 10am for this patient.  After careful review of history and examination, the risks and benefits of transesophageal echocardiogram have been explained including risks of esophageal damage, perforation (1:10,000 risk), bleeding, pharyngeal hematoma as well as other potential complications associated with conscious sedation including aspiration, arrhythmia, respiratory failure and death. Alternatives to treatment were discussed, questions were answered. Patient is willing to proceed.  TEE - Dr. Tenny Crawoss  @ 10:00 am on 4/717 . NPO after midnight. Meds with sips ok.   Little IshikawaErin E Hanna Aultman, NP 11/13/2015 2:13 PM

## 2015-11-14 ENCOUNTER — Inpatient Hospital Stay (HOSPITAL_COMMUNITY): Payer: Self-pay

## 2015-11-14 ENCOUNTER — Encounter (HOSPITAL_COMMUNITY)
Admission: EM | Disposition: A | Discharge status: Home / Self Care | Payer: Self-pay | Source: Home / Self Care | Attending: Internal Medicine

## 2015-11-14 ENCOUNTER — Encounter (HOSPITAL_COMMUNITY): Payer: Self-pay | Admitting: *Deleted

## 2015-11-14 DIAGNOSIS — I340 Nonrheumatic mitral (valve) insufficiency: Secondary | ICD-10-CM

## 2015-11-14 DIAGNOSIS — I639 Cerebral infarction, unspecified: Secondary | ICD-10-CM

## 2015-11-14 HISTORY — PX: TEE WITHOUT CARDIOVERSION: SHX5443

## 2015-11-14 LAB — BASIC METABOLIC PANEL
Anion gap: 10 (ref 5–15)
Anion gap: 7 (ref 5–15)
BUN: 5 mg/dL — AB (ref 6–20)
BUN: <5 mg/dL — ABNORMAL LOW (ref 6–20)
CALCIUM: 8.6 mg/dL — AB (ref 8.9–10.3)
CHLORIDE: 116 mmol/L — AB (ref 101–111)
CO2: 16 mmol/L — ABNORMAL LOW (ref 22–32)
CO2: 23 mmol/L (ref 22–32)
CREATININE: 0.6 mg/dL (ref 0.44–1.00)
Calcium: 8.3 mg/dL — ABNORMAL LOW (ref 8.9–10.3)
Chloride: 111 mmol/L (ref 101–111)
Creatinine, Ser: 0.61 mg/dL (ref 0.44–1.00)
GFR calc Af Amer: >60 mL/min (ref >60)
GFR calc Af Amer: >60 mL/min (ref >60)
GFR calc non Af Amer: >60 mL/min (ref >60)
GFR calc non Af Amer: >60 mL/min (ref >60)
Glucose, Bld: 91 mg/dL (ref 65–99)
Glucose, Bld: 102 mg/dL — ABNORMAL HIGH (ref 65–99)
Potassium: 6 mmol/L — ABNORMAL HIGH (ref 3.5–5.1)
Potassium: 5.4 mmol/L — ABNORMAL HIGH (ref 3.5–5.1)
SODIUM: 142 mmol/L (ref 135–145)
Sodium: 141 mmol/L (ref 135–145)

## 2015-11-14 LAB — BETA-2-GLYCOPROTEIN I ABS, IGG/M/A

## 2015-11-14 SURGERY — ECHOCARDIOGRAM, TRANSESOPHAGEAL
Anesthesia: Moderate Sedation

## 2015-11-14 MED ORDER — LIDOCAINE VISCOUS 2 % MT SOLN
OROMUCOSAL | Status: AC
  Filled 2015-11-14: qty 15

## 2015-11-14 MED ORDER — DIPHENHYDRAMINE HCL 50 MG/ML IJ SOLN
INTRAMUSCULAR | Status: AC
  Filled 2015-11-14: qty 1

## 2015-11-14 MED ORDER — LIDOCAINE VISCOUS 2 % MT SOLN
OROMUCOSAL | Status: DC | PRN
  Administered 2015-11-14: 15 mL via OROMUCOSAL

## 2015-11-14 MED ORDER — FENTANYL CITRATE (PF) 100 MCG/2ML IJ SOLN
INTRAMUSCULAR | Status: AC
  Filled 2015-11-14: qty 2

## 2015-11-14 MED ORDER — FENTANYL CITRATE (PF) 100 MCG/2ML IJ SOLN
INTRAMUSCULAR | Status: DC | PRN
  Administered 2015-11-14 (×3): 25 ug via INTRAVENOUS

## 2015-11-14 MED ORDER — MIDAZOLAM HCL 5 MG/ML IJ SOLN
INTRAMUSCULAR | Status: AC
  Filled 2015-11-14: qty 2

## 2015-11-14 MED ORDER — SODIUM CHLORIDE 0.9 % IV SOLN
INTRAVENOUS | Status: DC
  Administered 2015-11-14: 500 mL via INTRAVENOUS

## 2015-11-14 MED ORDER — MIDAZOLAM HCL 10 MG/2ML IJ SOLN
INTRAMUSCULAR | Status: DC | PRN
  Administered 2015-11-14 (×5): 2 mg via INTRAVENOUS

## 2015-11-14 MED ORDER — DIPHENHYDRAMINE HCL 50 MG/ML IJ SOLN
INTRAMUSCULAR | Status: DC | PRN
  Administered 2015-11-14 (×2): 25 mg via INTRAVENOUS

## 2015-11-14 MED FILL — ELIQUIS 5 MG TABLET: 5 | 30 days supply | Qty: 60 | Fill #0

## 2015-11-14 NOTE — Interval H&P Note (Signed)
History and Physical Interval Note:  11/14/2015 8:06 AM  Gina Ayala  has presented today for surgery, with the diagnosis of STROKE  The various methods of treatment have been discussed with the patient and family. After consideration of risks, benefits and other options for treatment, the patient has consented to  Procedure(s): TRANSESOPHAGEAL ECHOCARDIOGRAM (TEE) (N/A) as a surgical intervention .  The patient's history has been reviewed, patient examined, no change in status, stable for surgery.  I have reviewed the patient's chart and labs.  Questions were answered to the patient's satisfaction.     Dietrich PatesPaula Avila Albritton

## 2015-11-14 NOTE — Progress Notes (Signed)
Pt is being discharged home. Discharge instructions including but not limited to sign and symptom of stroke, Dr Lynnae Prudeappintment follow up were given to patient and family.

## 2015-11-14 NOTE — Op Note (Signed)
MV normal  Trace MR AV normal  NO AI TV normal Triv TR PV normal   LA, LAA without masses NO PFO by color doppler or with injection of agitated saline  LVEF normal Thoracic aorta is normal  Full report to follow

## 2015-11-14 NOTE — Progress Notes (Signed)
*  PRELIMINARY RESULTS* Echocardiogram 2D Echocardiogram has been performed.  Gina Ayala, Gina Ayala 11/14/2015, 3:20 PM

## 2015-11-14 NOTE — Care Management Note (Signed)
Case Management Note  Patient Details  Name: Shelly RubensteinChrista L Brinkmeier MRN: [id number] Date of Birth: [birth year]  Subjective/Objective:                    Action/Plan: Patient discharging home with self care. [facility] pharmacy for her meds. No further needs per CM.   Expected Discharge Date:                  Expected Discharge Plan:     In-House Referral:     Discharge planning Services     Post Acute Care Choice:    Choice offered to:     DME Arranged:    DME Agency:     HH Arranged:    HH Agency:     Status of Service:  In process, will continue to follow  Medicare Important Message Given:    Date Medicare IM Given:    Medicare IM give by:    Date Additional Medicare IM Given:    Additional Medicare Important Message give by:     If discussed at Long Length of Stay Meetings, dates discussed:    Additional Comments:  Kermit BaloKelli F Rickita Forstner, RN 11/14/2015, 2:07 PM

## 2015-11-14 NOTE — Discharge Summary (Signed)
[facility]  Physician Discharge Summary   Patient ID: Gina Ayala MRN: [id number] DOB/AGE: [birth year] 41 y.o.  Admit date: 11/11/2015 Discharge date: 11/14/2015  PCP: [facility]  DISCHARGE DIAGNOSES:  Principal Problem:   Arterial ischemic stroke, MCA (middle cerebral artery), right, acute (HCC) Active Problems:   Acute right MCA stroke (HCC)   RECOMMENDATIONS FOR OUTPATIENT FOLLOW UP: 1. Patient to follow-up with outpatient providers. 2. Patient has been started on oral anticoagulation 3. Outpatient monitoring of mild transaminitis   DISCHARGE CONDITION: fair  Diet recommendation: Heart healthy  Filed Weights   11/11/15 1805  Weight: 81.647 kg (180 lb)    INITIAL HISTORY: 40 year old Caucasian female with a past medical history of tobacco abuse and no other health problems except for anxiety, presented with left-sided weakness, facial droop. Symptoms resolved by the time the patient arrived at the emergency department. Patient was hospitalized for further management.  Consultations:  Neurology  Procedures: Transthoracic echocardiogram Study Conclusions - Left ventricle: The cavity size was normal. Systolic function was normal. The estimated ejection fraction was in the range of 55% to 60%. Wall motion was normal; there were no regional wall motion abnormalities. Left ventricular diastolic function parameters were normal.  Transesophageal echocardiogram No clots noted. No PFO.  HOSPITAL COURSE:   Acute stroke involving right MCA territory Patient was admitted to the hospital. Neurology was consulted. Patient underwent stroke workup. There was suspicion of a focal dissection in the distal right common carotid artery on CT angiogram. However, neurology thinks that this is a clot. TEE was done which did not reveal any source of embolization. No PFO was noted. Echocardiogram report as above. LDL is 56.  HbA1c is 5.5. Patient was seen by physical therapy and does not have any outpatient needs. Anticoagulation for 3 months has been recommended by neurology. They will follow in the office. Patient has been started on Eliquis.  Anxiety state Likely due to the above. Continue Xanax as needed.  Hypokalemia Potassium was repleted. Mild metabolic acidosis was also noted, but normal this morning. She was noted to have hyperkalemia this morning, however, the sample was hemolyzed. Repeat sample was also hemolyzed, which showed improvement in potassium level. No further intervention or evaluation is necessary.   Mild transaminitis Stable. Etiology unclear. Hepatitis panel is negative. Outpatient monitoring.  Overall, stable. Patient was monitored for a few hours after TEE. She was bradycardic. Heart rate is improved. Patient feels well and wishes to go home. Ok for discharge today.    PERTINENT LABS:  The results of significant diagnostics from this hospitalization (including imaging, microbiology, ancillary and laboratory) are listed below for reference.     Labs: Basic Metabolic Panel:  Recent Labs Lab 11/11/15 1900 11/11/15 1911 11/11/15 2345 11/12/15 0646 11/13/15 0532 11/14/15 0300 11/14/15 0615  NA 140 140  --  141 144 142 141  K 3.3* 3.2*  --  3.0* 3.8 6.0* 5.4*  CL 105 102  --  106 113* 116* 111  CO2 24  --   --  22 19* 16* 23  GLUCOSE 128* 120*  --  105* 103* 91 102*  BUN 8 8  --  6 5* 5* <5*  CREATININE 0.71 0.70 0.69 0.66 0.58 0.60 0.61  CALCIUM 9.4  --   --  8.7* 8.5* 8.6* 8.3*   Liver Function Tests:  Recent Labs Lab 11/11/15 1900 11/12/15 0646 11/13/15 0532  AST 54* 51* 50*  ALT 67*  64* 61*  ALKPHOS 58 51 45  BILITOT 0.7 0.4 0.5  PROT 6.8 6.0* 5.4*  ALBUMIN 3.8 3.3* 3.0*   CBC:  Recent Labs Lab 11/11/15 1900 11/11/15 1911 11/11/15 2345 11/12/15 0646 11/13/15 0532  WBC 12.3*  --  11.3* 9.3 10.6*  NEUTROABS 7.3  --   --   --   --   HGB 16.2* 15.6*  15.5* 14.7 14.0  HCT 45.8 46.0 44.7 42.2 40.4  MCV 95.8  --  93.9 96.1 96.7  PLT 420*  --  379 433* PLATELET CLUMPS NOTED ON SMEAR, COUNT APPEARS ADEQUATE     IMAGING STUDIES Ct Angio Head W/cm &/or Wo Cm  11/12/2015  ADDENDUM REPORT: 11/12/2015 08:02 ADDENDUM: There is a dictation error in the impression portion of the initially dictated report. Impression should state: Focal intimal irregularity (not regularity). Electronically Signed   By: Rise MuBenjamin  McClintock M.D.   On: 11/12/2015 08:02  11/12/2015  CLINICAL DATA:  Initial evaluation for acute stroke. EXAM: CT ANGIOGRAPHY HEAD AND NECK TECHNIQUE: Multidetector CT imaging of the head and neck was performed using the standard protocol during bolus administration of intravenous contrast. Multiplanar CT image reconstructions and MIPs were obtained to evaluate the vascular anatomy. Carotid stenosis measurements (when applicable) are obtained utilizing NASCET criteria, using the distal internal carotid diameter as the denominator. CONTRAST:  50 cc of Isovue 370. COMPARISON:  Prior brain MRI from 11/11/2015 FINDINGS: CT HEAD Subtle evolving areas of acute right MCA territory infarcts are seen, most evident within the right parietal lobe. These are better evaluated on previous brain MRI. No significant mass effect or associated hemorrhage. No other acute large vessel territory infarct. Deep gray nuclei maintained. No acute intracranial hemorrhage. No mass lesion or midline shift. No mass effect. No hydrocephalus. No extra-axial fluid collection. Scalp soft tissues within normal limits. No acute abnormality about the orbits. Paranasal sinuses are clear.  No mastoid effusion. Calvarium intact. CTA NECK Aortic arch: Visualized aortic arch of normal caliber with normal branch pattern. No high-grade stenosis at the origin of the great vessels. Visualized subclavian arteries well opacified and normal in appearance. Right carotid system: Right common carotid artery  widely patent at its origin. There is a focal filling defect arising from the medial aspect of the right common carotid artery approximately 1.5-2 cm proximal to the bifurcation (series 502, image 109). Intraluminal filling defects within the medial aspect of the right common carotid artery distal of ill, extending cephalad into the right carotid bifurcation. Filling defect extends up to the level of the origin of the right ICA. There is some extension into the proximal right ECA (series 502, image 121). Given the somewhat long segment nature of this finding, is small focal dissection is favored. There is mild to moderate stenosis of up to approximately 50% at the most narrow point. Distally, the right ICA is widely patent to the skullbase without other intimal irregularity, dissection, or stenosis. Left carotid system: Left common carotid artery patent from its origin to the bifurcation. Left ICA widely patent from the bifurcation to the skullbase. No dissection, stenosis, or vascular occlusion within the left carotid artery system. Vertebral arteries:Both vertebral arteries arise from the subclavian arteries. Left vertebral artery dominant. Vertebral arteries widely patent along their entire course without stenosis, dissection, or occlusion. Skeleton: No acute osseous abnormality. No worrisome lytic or blastic osseous lesions. Other neck: Visualized lungs are clear. Visualized superior mediastinum within normal limits. Thyroid gland normal. No adenopathy within the neck. No acute  soft tissue abnormality. CTA HEAD Anterior circulation: The petrous, cavernous, and supraclinoid segments of the ICAs are widely patent bilaterally. A1 segments, anterior communicating artery, and anterior cerebral arteries well opacified. M1 segments widely patent without stenosis or occlusion. No proximal M2 branch occlusion. Distal MCA branches fairly symmetric. Posterior circulation: Left vertebral artery dominant and widely patent to  the vertebrobasilar junction. Diminutive right vertebral artery patent as well. Posterior inferior cerebral arteries patent. Basilar artery widely patent. Superior cerebral arteries patent bilaterally. Both posterior cerebral arteries arise from the basilar artery and are well opacified to their distal aspects. Venous sinuses: Patent without evidence for venous sinus thrombosis. Anatomic variants: No anatomic variant.  No aneurysm. Delayed phase: Minimal leptomeningeal enhancement about the evolving right MCA territory infarcts. No other pathologic enhancement. IMPRESSION: 1. Focal intimal regularity at the medial aspect of the distal right common carotid artery extending into the right carotid bifurcation, likely reflecting a small focal dissection. Finding could certainly represent an embolic source for patient's right MCA territory infarcts. 2. Otherwise normal CTA of the head and neck. 3. Evolving ischemic right posterior MCA territory infarcts. Results were called by telephone at the time of interpretation on 11/12/2015 at 4:26 am to Dr. Cherylynn Ridges, who verbally acknowledged these results. Electronically Signed: By: Rise Mu M.D. On: 11/12/2015 04:30   Dg Chest 2 View  11/12/2015  CLINICAL DATA:  Stroke. EXAM: CHEST  2 VIEW COMPARISON:  No recent prior. FINDINGS: Mediastinum hilar structures normal. Lungs are clear. Heart size normal. No pleural effusion or pneumothorax. Thoracic spine scoliosis. IMPRESSION: No acute cardiopulmonary disease. Electronically Signed   By: Maisie Fus  Register   On: 11/12/2015 07:24   Ct Angio Neck W/cm &/or Wo/cm  11/12/2015  ADDENDUM REPORT: 11/12/2015 08:02 ADDENDUM: There is a dictation error in the impression portion of the initially dictated report. Impression should state: Focal intimal irregularity (not regularity). Electronically Signed   By: Rise Mu M.D.   On: 11/12/2015 08:02  11/12/2015  CLINICAL DATA:  Initial evaluation for acute stroke. EXAM: CT  ANGIOGRAPHY HEAD AND NECK TECHNIQUE: Multidetector CT imaging of the head and neck was performed using the standard protocol during bolus administration of intravenous contrast. Multiplanar CT image reconstructions and MIPs were obtained to evaluate the vascular anatomy. Carotid stenosis measurements (when applicable) are obtained utilizing NASCET criteria, using the distal internal carotid diameter as the denominator. CONTRAST:  50 cc of Isovue 370. COMPARISON:  Prior brain MRI from 11/11/2015 FINDINGS: CT HEAD Subtle evolving areas of acute right MCA territory infarcts are seen, most evident within the right parietal lobe. These are better evaluated on previous brain MRI. No significant mass effect or associated hemorrhage. No other acute large vessel territory infarct. Deep gray nuclei maintained. No acute intracranial hemorrhage. No mass lesion or midline shift. No mass effect. No hydrocephalus. No extra-axial fluid collection. Scalp soft tissues within normal limits. No acute abnormality about the orbits. Paranasal sinuses are clear.  No mastoid effusion. Calvarium intact. CTA NECK Aortic arch: Visualized aortic arch of normal caliber with normal branch pattern. No high-grade stenosis at the origin of the great vessels. Visualized subclavian arteries well opacified and normal in appearance. Right carotid system: Right common carotid artery widely patent at its origin. There is a focal filling defect arising from the medial aspect of the right common carotid artery approximately 1.5-2 cm proximal to the bifurcation (series 502, image 109). Intraluminal filling defects within the medial aspect of the right common carotid artery distal of ill, extending  cephalad into the right carotid bifurcation. Filling defect extends up to the level of the origin of the right ICA. There is some extension into the proximal right ECA (series 502, image 121). Given the somewhat long segment nature of this finding, is small focal  dissection is favored. There is mild to moderate stenosis of up to approximately 50% at the most narrow point. Distally, the right ICA is widely patent to the skullbase without other intimal irregularity, dissection, or stenosis. Left carotid system: Left common carotid artery patent from its origin to the bifurcation. Left ICA widely patent from the bifurcation to the skullbase. No dissection, stenosis, or vascular occlusion within the left carotid artery system. Vertebral arteries:Both vertebral arteries arise from the subclavian arteries. Left vertebral artery dominant. Vertebral arteries widely patent along their entire course without stenosis, dissection, or occlusion. Skeleton: No acute osseous abnormality. No worrisome lytic or blastic osseous lesions. Other neck: Visualized lungs are clear. Visualized superior mediastinum within normal limits. Thyroid gland normal. No adenopathy within the neck. No acute soft tissue abnormality. CTA HEAD Anterior circulation: The petrous, cavernous, and supraclinoid segments of the ICAs are widely patent bilaterally. A1 segments, anterior communicating artery, and anterior cerebral arteries well opacified. M1 segments widely patent without stenosis or occlusion. No proximal M2 branch occlusion. Distal MCA branches fairly symmetric. Posterior circulation: Left vertebral artery dominant and widely patent to the vertebrobasilar junction. Diminutive right vertebral artery patent as well. Posterior inferior cerebral arteries patent. Basilar artery widely patent. Superior cerebral arteries patent bilaterally. Both posterior cerebral arteries arise from the basilar artery and are well opacified to their distal aspects. Venous sinuses: Patent without evidence for venous sinus thrombosis. Anatomic variants: No anatomic variant.  No aneurysm. Delayed phase: Minimal leptomeningeal enhancement about the evolving right MCA territory infarcts. No other pathologic enhancement. IMPRESSION:  1. Focal intimal regularity at the medial aspect of the distal right common carotid artery extending into the right carotid bifurcation, likely reflecting a small focal dissection. Finding could certainly represent an embolic source for patient's right MCA territory infarcts. 2. Otherwise normal CTA of the head and neck. 3. Evolving ischemic right posterior MCA territory infarcts. Results were called by telephone at the time of interpretation on 11/12/2015 at 4:26 am to Dr. Cherylynn Ridges, who verbally acknowledged these results. Electronically Signed: By: Rise Mu M.D. On: 11/12/2015 04:30   Mr Brain Wo Contrast (neuro Protocol)  11/11/2015  CLINICAL DATA:  Transient left-sided weakness. History of anxiety, over the counter cold medications, and daily smoking history. Symptoms reportedly developed earlier today at 1715 hours. Patient states she is back to normal. EXAM: MRI HEAD WITHOUT CONTRAST TECHNIQUE: Multiplanar, multiecho pulse sequences of the brain and surrounding structures were obtained without intravenous contrast. COMPARISON:  None. FINDINGS: Slight motion.  Overall study diagnostic. Multifocal areas of acute infarction affect the RIGHT hemisphere. These are most notable in the RIGHT insula, RIGHT posterior temporal lobe, RIGHT anterior parietal lobe, RIGHT frontal and RIGHT posterior frontal lobe, and regional white matter. The largest confluent area affects the RIGHT parietal lobe, and could correlate with the patient's sensory symptoms. Within limits for detection on MR, these infarcts are all are nonhemorrhagic. No left-sided or posterior circulation involvement. No hemorrhage, mass lesion, hydrocephalus, or extra-axial fluid. Slight premature for age atrophy. Minor white matter disease, nonspecific. Flow voids are maintained. No visible stenosis, dissection, or large vessel occlusion No midline abnormality. Extracranial soft tissues unremarkable. LEFT vertebral dominant. IMPRESSION:  Multifocal areas of acute infarction affecting the RIGHT MCA  territory. These appear nonhemorrhagic, but noncontrast CT should be obtained to confirm. Slight premature for age atrophy, minor white matter disease, nonspecific. These results were called by telephone at the time of interpretation on 11/11/2015 at 8:59 pm to Dr. Pricilla Loveless , who verbally acknowledged these results. Electronically Signed   By: Elsie Stain M.D.   On: 11/11/2015 21:01    DISCHARGE EXAMINATION: Filed Vitals:   11/14/15 1050 11/14/15 1100 11/14/15 1110 11/14/15 1331  BP: 178/76 159/114 158/81 126/73  Pulse: 49 49 40 64  Temp:    97.6 F (36.4 C)  TempSrc:    Oral  Resp: 14 17 11 16   Height:      Weight:      SpO2: 96% 95% 95% 100%   General appearance: alert, cooperative, appears stated age and no distress Resp: clear to auscultation bilaterally Cardio: regular rate and rhythm, S1, S2 normal, no murmur, click, rub or gallop GI: soft, non-tender; bowel sounds normal; no masses,  no organomegaly Extremities: extremities normal, atraumatic, no cyanosis or edema   DISPOSITION: Home  Discharge Instructions    Ambulatory referral to Neurology    Complete by:  As directed   Please schedule post stroke follow up in 2 months.     Call MD for:  difficulty breathing, headache or visual disturbances    Complete by:  As directed      Call MD for:  extreme fatigue    Complete by:  As directed      Call MD for:  persistant dizziness or light-headedness    Complete by:  As directed      Call MD for:  persistant nausea and vomiting    Complete by:  As directed      Call MD for:  severe uncontrolled pain    Complete by:  As directed      Call MD for:  temperature >100.4    Complete by:  As directed      Diet - low sodium heart healthy    Complete by:  As directed      Discharge instructions    Complete by:  As directed   Please keep your follow-up appointments. Taking medications as prescribed.  You were cared  for by a hospitalist during your hospital stay. If you have any questions about your discharge medications or the care you received while you were in the hospital after you are discharged, you can call the unit and asked to speak with the hospitalist on call if the hospitalist that took care of you is not available. Once you are discharged, your primary care physician will handle any further medical issues. Please note that NO REFILLS for any discharge medications will be authorized once you are discharged, as it is imperative that you return to your primary care physician (or establish a relationship with a primary care physician if you do not have one) for your aftercare needs so that they can reassess your need for medications and monitor your lab values. If you do not have a primary care physician, you can call [phone] for a physician referral.     Increase activity slowly    Complete by:  As directed            ALLERGIES:  Allergies  Allergen Reactions  . Anesthetics, Amide     Itching      Current Discharge Medication List    START taking these medications   Details  ALPRAZolam (XANAX) 0.5 MG tablet  Take 1 tablet (0.5 mg total) by mouth 2 (two) times daily as needed for anxiety. Qty: 10 tablet, Refills: 0    apixaban (ELIQUIS) 5 MG TABS tablet Take 1 tablet (5 mg total) by mouth 2 (two) times daily. Qty: 60 tablet, Refills: 3      STOP taking these medications     ibuprofen (ADVIL,MOTRIN) 200 MG tablet        Follow-up Information    Follow up with [facility] On 11/27/2015.   Why:  [phone]. Time of appt is for 9:30 am. Please bring a picture ID and your current meds to the appt.   Contact information:   [address] [place] [address]       Follow up with Delia Heady, MD In 2 months.   Specialties:  Neurology, Radiology   Why:  Stroke Clinic, Office will call you with appointment date & time   Contact information:   [address] [place] [address] [phone]       TOTAL DISCHARGE TIME: 35 minutes  [facility]  [facility] Pager [phone]  11/14/2015, 2:06 PM

## 2015-11-14 NOTE — Progress Notes (Signed)
STROKE TEAM PROGRESS NOTE   HISTORY OF PRESENT ILLNESS Gina Ayala is an 40 y.o. female with hx of only smoking presented to the ER with an episode of L arm weakness and numbness and possible L facial droop that lasted 5 minutes earlier midday today. Upon arrival to the ER she had a NIHSS of zero, MRI was done that showed multifocal R MCA area strokes. Patient was not administered IV t-PA. She was admitted for further evaluation and treatment.   SUBJECTIVE (INTERVAL HISTORY) Her mother is at the bedside .TEE shows no clots or PFO...  Overall she feels her condition is gradually improving.    OBJECTIVE Temp:  [97.6 F (36.4 C)-98.4 F (36.9 C)] 97.6 F (36.4 C) (04/07 1331) Pulse Rate:  [40-76] 64 (04/07 1331) Cardiac Rhythm:  [-] Normal sinus rhythm (04/06 1900) Resp:  [11-21] 16 (04/07 1331) BP: (98-180)/(72-114) 126/73 mmHg (04/07 1331) SpO2:  [95 %-100 %] 100 % (04/07 1331)  CBC:  Recent Labs Lab 11/11/15 1900  11/12/15 0646 11/13/15 0532  WBC 12.3*  < > 9.3 10.6*  NEUTROABS 7.3  --   --   --   HGB 16.2*  < > 14.7 14.0  HCT 45.8  < > 42.2 40.4  MCV 95.8  < > 96.1 96.7  PLT 420*  < > 433* PLATELET CLUMPS NOTED ON SMEAR, COUNT APPEARS ADEQUATE  < > = values in this interval not displayed.  Basic Metabolic Panel:   Recent Labs Lab 11/14/15 0300 11/14/15 0615  NA 142 141  K 6.0* 5.4*  CL 116* 111  CO2 16* 23  GLUCOSE 91 102*  BUN 5* <5*  CREATININE 0.60 0.61  CALCIUM 8.6* 8.3*    Lipid Panel:     Component Value Date/Time   CHOL 109 11/12/2015 0646   TRIG 106 11/12/2015 0646   HDL 32* 11/12/2015 0646   CHOLHDL 3.4 11/12/2015 0646   VLDL 21 11/12/2015 0646   LDLCALC 56 11/12/2015 0646   HgbA1c:  Lab Results  Component Value Date   HGBA1C 5.5 11/12/2015   Urine Drug Screen: No results found for: LABOPIA, COCAINSCRNUR, LABBENZ, AMPHETMU, THCU, LABBARB    IMAGING  Dg Chest 2 View 11/12/2015  No acute cardiopulmonary disease.   Ct Angio Head & Neck  W/cm &/or Wo/cm 11/12/2015  1. Focal intimal irregularity at the medial aspect of the distal right common carotid artery extending into the right carotid bifurcation, likely reflecting a small focal dissection. Finding could certainly represent an embolic source for patient's right MCA territory infarcts. 2. Otherwise normal CTA of the head and neck. 3. Evolving ischemic right posterior MCA territory infarcts.   Mr Brain Wo Contrast (neuro Protocol) 11/11/2015   Multifocal areas of acute infarction affecting the RIGHT MCA territory. These appear nonhemorrhagic, but noncontrast CT should be obtained to confirm. Slight premature for age atrophy, minor white matter disease, nonspecific.    PHYSICAL EXAM Pleasant middle aged Caucasian lady not in distress. . Afebrile. Head is nontraumatic. Neck is supple without bruit.    Cardiac exam no murmur or gallop. Lungs are clear to auscultation. Distal pulses are well felt. Neurological Exam :  Awake alert oriented x 3 normal speech and language. No face asymmetry. Tongue midline. No drift. Mild diminished fine finger movements on left. Orbits right over left upper extremity. Mild left grip weak.. Diminished left hand  sensation . Normal coordination. ASSESSMENT/PLAN Ms. Gina Ayala is a 40 y.o. female with history of smoking presenting with  L arm weakness and numbness with possible L facial droop. She did not receive IV t-PA.   Stroke:  Non-dominant  Multifocal R MCA territory infarcts embolic secondary to unknown source  Resultant  Mild left hand weakness and numbness  MRI  Multifocal R MCA territory infarcts  CTA head and neck R CCA with small focal dissection. evolving R MCA infarct. Otherwise normal. 2D Echo  Left ventricle: The cavity size was normal. Systolic function was  normal. The estimated ejection fraction was in the range of 55%  to 60%. Wall motion was normal; there were no regional wall  motion abnormalities. Left ventricular diastolic  function   parameters were normal.  TEE to look for embolic source. Arranged with [facility] for tomorrow.  If positive for PFO (patent foramen ovale), check bilateral lower extremity venous dopplers to rule out DVT as possible source of stroke. (I have made patient NPO after midnight tonight).   LDL 56  HgbA1c 5.5    Hypercoagulable panel  pending  Lovenox 40 mg sq daily for VTE prophylaxis Diet Heart Room service appropriate?: Yes; Fluid consistency:: Thin Diet - low sodium heart healthy  No antithrombotic prior to admission, now on aspirin 325 mg daily  Patient counseled to be compliant with her antithrombotic medications  Ongoing aggressive stroke risk factor management  Therapy recommendations:  No PT  Disposition:  Return home  Other Stroke Risk Factors  Cigarette smoker, advised to stop smoking  Overweight, Body mass index is 29.07 kg/(m^2).   Other Active Problems  Hypokalemia, 3.0  Leukocytosis, 12.3  Hospital day # 3  I have personally examined this patient, reviewed notes, independently viewed imaging studies, participated in medical decision making and plan of care. I have made any additions or clarifications directly to the above note. Agree with note above. She presented with left UE weakness and numbness from right MCA embolic infarct from proximal right ICA clot etiology unclear possibly underlying small dissection.She is at risk for recurrent strokes, TIAs,neurological worsening and needs shorterm anticoagulation to prevent distal emboli from left ICA clot.I discussed risk of hemorrhage from anticoagulation, risk of hemorrhagic transformation versus recurrent strokes. .DC home on eliquis and f/u as outpatient 2 months and will repeat Ct angio neck  Delia Heady, MD Medical Director [facility] Pager: [phone] 11/14/2015 4:50 PM     To contact Stroke Continuity provider, please refer to [email or web address]. After hours,  contact General Neurology

## 2015-11-14 NOTE — H&P (View-Only) (Signed)
STROKE TEAM PROGRESS NOTE   HISTORY OF PRESENT ILLNESS Gina Ayala is an 40 y.o. female with hx of only smoking presented to the ER with an episode of L arm weakness and numbness and possible L facial droop that lasted 5 minutes earlier midday today. Upon arrival to the ER she had a NIHSS of zero, MRI was done that showed multifocal R MCA area strokes. Patient was not administered IV t-PA. She was admitted for further evaluation and treatment.   SUBJECTIVE (INTERVAL HISTORY) Her mother is at the bedside and has lots of questions about TEE which I addressed..  Overall she feels her condition is gradually improving.    OBJECTIVE Temp:  [97.5 F (36.4 C)-98.9 F (37.2 C)] 97.9 F (36.6 C) (04/06 1410) Pulse Rate:  [63-74] 69 (04/06 1410) Cardiac Rhythm:  [-] Sinus bradycardia (04/06 0700) Resp:  [18] 18 (04/06 1410) BP: (116-140)/(61-95) 140/92 mmHg (04/06 1410) SpO2:  [96 %-100 %] 98 % (04/06 1410)  CBC:  Recent Labs Lab 11/11/15 1900  11/12/15 0646 11/13/15 0532  WBC 12.3*  < > 9.3 10.6*  NEUTROABS 7.3  --   --   --   HGB 16.2*  < > 14.7 14.0  HCT 45.8  < > 42.2 40.4  MCV 95.8  < > 96.1 96.7  PLT 420*  < > 433* PLATELET CLUMPS NOTED ON SMEAR, COUNT APPEARS ADEQUATE  < > = values in this interval not displayed.  Basic Metabolic Panel:   Recent Labs Lab 11/12/15 0646 11/13/15 0532  NA 141 144  K 3.0* 3.8  CL 106 113*  CO2 22 19*  GLUCOSE 105* 103*  BUN 6 5*  CREATININE 0.66 0.58  CALCIUM 8.7* 8.5*    Lipid Panel:     Component Value Date/Time   CHOL 109 11/12/2015 0646   TRIG 106 11/12/2015 0646   HDL 32* 11/12/2015 0646   CHOLHDL 3.4 11/12/2015 0646   VLDL 21 11/12/2015 0646   LDLCALC 56 11/12/2015 0646   HgbA1c:  Lab Results  Component Value Date   HGBA1C 5.5 11/12/2015   Urine Drug Screen: No results found for: LABOPIA, COCAINSCRNUR, LABBENZ, AMPHETMU, THCU, LABBARB    IMAGING  Dg Chest 2 View 11/12/2015  No acute cardiopulmonary disease.    Ct Angio Head & Neck W/cm &/or Wo/cm 11/12/2015  1. Focal intimal irregularity at the medial aspect of the distal right common carotid artery extending into the right carotid bifurcation, likely reflecting a small focal dissection. Finding could certainly represent an embolic source for patient's right MCA territory infarcts. 2. Otherwise normal CTA of the head and neck. 3. Evolving ischemic right posterior MCA territory infarcts.   Mr Brain Wo Contrast (neuro Protocol) 11/11/2015   Multifocal areas of acute infarction affecting the RIGHT MCA territory. These appear nonhemorrhagic, but noncontrast CT should be obtained to confirm. Slight premature for age atrophy, minor white matter disease, nonspecific.    PHYSICAL EXAM Pleasant middle aged Caucasian lady not in distress. . Afebrile. Head is nontraumatic. Neck is supple without bruit.    Cardiac exam no murmur or gallop. Lungs are clear to auscultation. Distal pulses are well felt. Neurological Exam :  Awake alert oriented x 3 normal speech and language. No face asymmetry. Tongue midline. No drift. Mild diminished fine finger movements on left. Orbits right over left upper extremity. Mild left grip weak.. Diminished left hand  sensation . Normal coordination. ASSESSMENT/PLAN Ms. Gina Ayala is a 40 y.o. female with history of   smoking presenting with L arm weakness and numbness with possible L facial droop. She did not receive IV t-PA.   Stroke:  Non-dominant  Multifocal R MCA territory infarcts embolic secondary to unknown source  Resultant  Mild left hand weakness and numbness  MRI  Multifocal R MCA territory infarcts  CTA head and neck R CCA with small focal dissection. evolving R MCA infarct. Otherwise normal. 2D Echo  Left ventricle: The cavity size was normal. Systolic function was  normal. The estimated ejection fraction was in the range of 55%  to 60%. Wall motion was normal; there were no regional wall  motion abnormalities.  Left ventricular diastolic function   parameters were normal.  TEE to look for embolic source. Arranged with [facility] for tomorrow.  If positive for PFO (patent foramen ovale), check bilateral lower extremity venous dopplers to rule out DVT as possible source of stroke. (I have made patient NPO after midnight tonight).   LDL 56  HgbA1c 5.5    Hypercoagulable panel  pending  Lovenox 40 mg sq daily for VTE prophylaxis Diet NPO time specified Diet Heart Room service appropriate?: Yes; Fluid consistency:: Thin  No antithrombotic prior to admission, now on aspirin 325 mg daily  Patient counseled to be compliant with her antithrombotic medications  Ongoing aggressive stroke risk factor management  Therapy recommendations:  No PT  Disposition:  Return home  Other Stroke Risk Factors  Cigarette smoker, advised to stop smoking  Overweight, Body mass index is 29.07 kg/(m^2).   Other Active Problems  Hypokalemia, 3.0  Leukocytosis, 12.3  Hospital day # 2  I have personally examined this patient, reviewed notes, independently viewed imaging studies, participated in medical decision making and plan of care. I have made any additions or clarifications directly to the above note. Agree with note above. She presented with left UE weakness and numbness from right MCA embolic infarct from proximal right ICA clot etiology unclear possibly underlying small dissection.She is at risk for recurrent strokes, TIAs,neurological worsening and needs shorterm anticoagulation to prevent distal emboli from left ICA clot.I discussed risk of hemorrhage from anticoagulation, risk of hemorrhagic transformation versus recurrent strokes. .Check TEE  Scheduled 4/7/17and hypercaog labs  Pramod Sethi, MD Medical Director [facility] Pager: [phone] 11/13/2015 3:37 PM     To contact Stroke Continuity provider, please refer to [email or web address]. After hours, contact  General Neurology 

## 2015-11-14 NOTE — Progress Notes (Addendum)
Patient's potassium level 6.0. Night coverage np notified.   Per NP, will repeat bmet.

## 2015-11-14 NOTE — Discharge Instructions (Signed)
Ischemic Stroke °An ischemic stroke (cerebrovascular accident) is the sudden death of brain tissue. It is a medical emergency. An ischemic stroke can cause permanent loss of brain function. This can cause problems with different parts of your body. °CAUSES °An ischemic stroke is caused by a decrease of oxygen supply to an area of your brain. It is usually the result of a small blood clot (embolus) or collection of cholesterol or fat (plaque) that blocks blood flow in the brain. An ischemic stroke can also be caused by blocked or damaged carotid arteries. °RISK FACTORS °· High blood pressure (hypertension). °· High cholesterol. °· Diabetes mellitus. °· Heart disease. °· The buildup of plaque in the blood vessels (peripheral artery disease or atherosclerosis). °· The buildup of plaque in the blood vessels that provide blood and oxygen to the brain (carotid artery stenosis). °· An abnormal heart rhythm (atrial fibrillation). °· Obesity. °· Smoking cigarettes. °· Taking oral contraceptives, especially in combination with using tobacco. °· Physical inactivity. °· A diet that is high in fats, salt (sodium), and calories. °· Excessive alcohol use. °· Use of illegal drugs, especially cocaine and methamphetamine. °· Being African American. °· Being over the age of 55 years. °· Family history of stroke. °· Previous history of blood clots, stroke, TIA (transient ischemic attack), or heart attack. °· Sickle cell disease. °SIGNS AND SYMPTOMS °These symptoms usually develop suddenly, or you may notice them after waking up from sleep. Symptoms may include sudden: °· Weakness or numbness in your face, arm, or leg, especially on one side of your body. °· Confusion. °· Trouble speaking (aphasia) or understanding speech. °· Trouble seeing with one or both eyes. °· Trouble walking or difficulty moving your arms or legs. °· Dizziness. °· Loss of balance or coordination. °· Severe headache with no known cause. The headache is often  described as the worst headache ever experienced. °DIAGNOSIS °Your health care provider can often determine the presence or absence of an ischemic stroke based on your symptoms, history, and physical exam. CT (computed tomography) of the brain is usually performed to confirm the stroke, determine causes, and determine stroke severity. Other tests may be done to find the cause of the stroke. These tests may include: °· ECG (electrocardiogram). °· Continuous heart monitoring. °· Echocardiogram. °· Carotid ultrasound. °· MRI. °· A scan of the brain circulation. °· Blood tests. °TREATMENT °It is very important to seek treatment at the first sign of stroke symptoms. Your health care provider may perform the following treatments within 6 hours of the onset of stroke symptoms: °· Medicine to dissolve the blood clot (thrombolytic). °· Inserting a device into the affected artery to remove the blood clot. °These treatments may not be effective if too much time has passed since your stroke symptoms began. Even if you do not know when your symptoms began, get treatment as soon as possible. There are other treatment options that may be given, such as: °· Oxygen. °· IV fluids. °· Medicines to thin the blood (anticoagulants). °· A procedure to widen blocked arteries. °Your treatment will depend on how long you have had your symptoms, the severity of your symptoms, and the cause of your symptoms. °Your health care provider will take measures to prevent short-term and long-term complications of stroke, such as: °· Breathing foreign material into the lungs (aspiration pneumonia). °· Blood clots in the legs. °· Bedsores. °· Falls. °Medicines and dietary changes may be used to help treat and manage risk factors for stroke, such as   diabetes and high blood pressure. If any of your body's functions were impaired by stroke, you may work with physical, speech, or occupational therapists to help you recover. HOME CARE INSTRUCTIONS  Take  medicines only as directed by your health care provider. Follow the directions carefully. Medicines may be used to control risk factors for a stroke. Be sure that you understand all your medicine instructions.  If swallow studies have determined that your swallowing reflex is present, you should eat healthy foods. Foods may need to be a soft or pureed consistency, or you may need to take small bites in order to avoid aspirating or choking.  Follow physical activity guidelines as directed by your health care team.  Do not use any tobacco products, including cigarettes, chewing tobacco, or electronic cigarettes. If you smoke, quit. If you need help quitting, ask your health care provider.  Limit or stop alcohol use.  A safe home environment is important to reduce the risk of falls. Your health care provider may arrange for specialists to evaluate your home. Having grab bars in the bedroom and bathroom is often important. Your health care provider may arrange for equipment to be used at home, such as raised toilets and a seat for the shower.  Ongoing physical, occupational, and speech therapy may be needed to maximize your recovery after a stroke. If you have been advised to use a walker or a cane, use it at all times. Be sure to keep your therapy appointments.  Keep all follow-up visits with your health care provider. This is very important. This includes any referrals, therapy, rehabilitation, and lab tests. Proper follow-up can prevent another stroke from occurring. PREVENTION The risk of a stroke can be decreased by appropriately treating high blood pressure, high cholesterol, diabetes, heart disease, and obesity. It can also be decreased by quitting smoking, limiting alcohol, and staying physically active. SEEK IMMEDIATE MEDICAL CARE IF:  You have sudden weakness or numbness in your face, arm, or leg, especially on one side of your body.  You have sudden confusion.  You have sudden trouble  speaking (aphasia) or understanding.  You have sudden trouble seeing with one or both eyes.  You have sudden trouble walking or difficulty moving your arms or legs.  You have sudden dizziness.  You have a sudden loss of balance or coordination.  You have a sudden, severe headache with no known cause.  You have a partial or total loss of consciousness. Any of these symptoms may represent a serious problem that is an emergency. Do not wait to see if the symptoms will go away. Get medical help right away. Call your local emergency services (911 in [place] Do not drive yourself to the hospital.   This information is not intended to replace advice given to you by your health care provider. Make sure you discuss any questions you have with your health care provider.   Document Released: 05/10/2014 Document Reviewed: 05/10/2014 Elsevier Interactive Patient Education 2016 Elsevier Inc.  Apixaban oral tablets What is this medicine? APIXABAN (a PIX a ban) is an anticoagulant (blood thinner). It is used to lower the chance of stroke in people with a medical condition called atrial fibrillation. It is also used to treat or prevent blood clots in the lungs or in the veins. This medicine may be used for other purposes; ask your health care provider or pharmacist if you have questions. What should I tell my health care provider before I take this medicine? They need to  know if you have any of these conditions: -bleeding disorders -bleeding in the brain -blood in your stools (black or tarry stools) or if you have blood in your vomit -history of stomach bleeding -kidney disease -liver disease -mechanical heart valve -an unusual or allergic reaction to apixaban, other medicines, foods, dyes, or preservatives -pregnant or trying to get pregnant -breast-feeding How should I use this medicine? Take this medicine by mouth with a glass of water. Follow the directions on the prescription label. You can  take it with or without food. If it upsets your stomach, take it with food. Take your medicine at regular intervals. Do not take it more often than directed. Do not stop taking except on your doctor's advice. Stopping this medicine may increase your risk of a blot clot. Be sure to refill your prescription before you run out of medicine. Talk to your pediatrician regarding the use of this medicine in children. Special care may be needed. Overdosage: If you think you have taken too much of this medicine contact a poison control center or emergency room at once. NOTE: This medicine is only for you. Do not share this medicine with others. What if I miss a dose? If you miss a dose, take it as soon as you can. If it is almost time for your next dose, take only that dose. Do not take double or extra doses. What may interact with this medicine? This medicine may interact with the following: -aspirin and aspirin-like medicines -certain medicines for fungal infections like ketoconazole and itraconazole -certain medicines for seizures like carbamazepine and phenytoin -certain medicines that treat or prevent blood clots like warfarin, enoxaparin, and dalteparin -clarithromycin -NSAIDs, medicines for pain and inflammation, like ibuprofen or naproxen -rifampin -ritonavir -St. John's wort This list may not describe all possible interactions. Give your health care provider a list of all the medicines, herbs, non-prescription drugs, or dietary supplements you use. Also tell them if you smoke, drink alcohol, or use illegal drugs. Some items may interact with your medicine. What should I watch for while using this medicine? Notify your doctor or health care professional and seek emergency treatment if you develop breathing problems; changes in vision; chest pain; severe, sudden headache; pain, swelling, warmth in the leg; trouble speaking; sudden numbness or weakness of the face, arm, or leg. These can be signs that  your condition has gotten worse. If you are going to have surgery, tell your doctor or health care professional that you are taking this medicine. Tell your health care professional that you use this medicine before you have a spinal or epidural procedure. Sometimes people who take this medicine have bleeding problems around the spine when they have a spinal or epidural procedure. This bleeding is very rare. If you have a spinal or epidural procedure while on this medicine, call your health care professional immediately if you have back pain, numbness or tingling (especially in your legs and feet), muscle weakness, paralysis, or loss of bladder or bowel control. Avoid sports and activities that might cause injury while you are using this medicine. Severe falls or injuries can cause unseen bleeding. Be careful when using sharp tools or knives. Consider using an Neurosurgeonelectric razor. Take special care brushing or flossing your teeth. Report any injuries, bruising, or red spots on the skin to your doctor or health care professional. What side effects may I notice from receiving this medicine? Side effects that you should report to your doctor or health care professional as soon  as possible: -allergic reactions like skin rash, itching or hives, swelling of the face, lips, or tongue -signs and symptoms of bleeding such as bloody or black, tarry stools; red or dark-brown urine; spitting up blood or brown material that looks like coffee grounds; red spots on the skin; unusual bruising or bleeding from the eye, gums, or nose This list may not describe all possible side effects. Call your doctor for medical advice about side effects. You may report side effects to FDA at 1-800-FDA-1088. Where should I keep my medicine? Keep out of the reach of children. Store at room temperature between 20 and 25 degrees C (68 and 77 degrees F). Throw away any unused medicine after the expiration date. NOTE: This sheet is a summary. It  may not cover all possible information. If you have questions about this medicine, talk to your doctor, pharmacist, or health care provider.    2016, Elsevier/Gold Standard. (2013-03-30 11:59:24)

## 2015-11-15 LAB — ANTIPHOSPHOLIPID SYNDROME EVAL, BLD
Anticardiolipin IgA: <9 APL U/mL (ref 0–11)
Anticardiolipin IgG: <9 GPL U/mL (ref 0–14)
DRVVT: 41.8 s (ref 0.0–44.0)
PHOSPHATYDALSERINE, IGA: 1 {APS'U} (ref 0–20)
PHOSPHATYDALSERINE, IGG: 1 {GPS'U} (ref 0–11)
PHOSPHATYDALSERINE, IGM: 12 {MPS'U} (ref 0–25)
PTT Lupus Anticoagulant: 31.2 s (ref 0.0–43.6)

## 2015-11-15 LAB — CARDIOLIPIN ANTIBODIES, IGG, IGM, IGA: Anticardiolipin IgA: <9 APL U/mL (ref 0–11)

## 2015-11-17 LAB — FACTOR 5 LEIDEN

## 2015-11-18 ENCOUNTER — Encounter (HOSPITAL_COMMUNITY): Payer: Self-pay | Admitting: Internal Medicine

## 2015-11-19 LAB — PROTHROMBIN GENE MUTATION

## 2015-11-27 ENCOUNTER — Ambulatory Visit: Payer: Self-pay | Admitting: Family Medicine

## 2015-12-25 MED FILL — ?ELIQUIS 5 MG TABLET: 5 | 28 days supply | Qty: 56 | Fill #1

## 2016-03-23 ENCOUNTER — Ambulatory Visit: Payer: Self-pay | Admitting: Neurology

## 2016-03-24 ENCOUNTER — Encounter: Payer: Self-pay | Admitting: Neurology

## 2016-06-01 ENCOUNTER — Encounter: Payer: Self-pay | Admitting: Neurology

## 2016-06-01 ENCOUNTER — Ambulatory Visit (INDEPENDENT_AMBULATORY_CARE_PROVIDER_SITE_OTHER): Payer: Self-pay | Admitting: Neurology

## 2016-06-01 VITALS — BP 128/91 | HR 83 | Ht 66.0 in | Wt 234.0 lb

## 2016-06-01 DIAGNOSIS — I639 Cerebral infarction, unspecified: Secondary | ICD-10-CM

## 2016-06-01 NOTE — Progress Notes (Signed)
[facility] [address] [place]. [place] [address] [phone]       OFFICE FOLLOW-UP NOTE  Gina Ayala Date of Birth:  Feb 08, 1976 Medical Record Number:  [id number]   HPI: 49 year Caucasian lady seen today for first office f/u visit after [facility] admission for stroke in April 2017.Gina Ayala is an 40 y.o. female with hx of only smoking presented to the ER  At [facility] on 11/11/15 with an episode of L arm weakness and numbness and possible L facial droop that lasted 5 minutes earlier midday today. Upon arrival to the ER she had a NIHSS of zero, MRI was done that showed multifocal R MCA area strokes. Patient was not administered IV t-PA. She was admitted for further evaluation and treatment. MRI scan of the brain showed small multifocal right MCA to brain infarct. CT angiogram of the head and neck showed no large vessel stenosis but there is a small area of abnormal signal in the right common carotid artery which was read by radiologist to be a small focal dissection but could have been a small clot. Transthoracic echo showed normal ejection fraction. Transesophageal echo showed no cardiac source of embolism or PFO. LDL cholesterol was 56 mg percent and HbA1c   was 5.5. Hypercoagulable panel including antiphospholipid antibodies, lupus anticoagulant, antithrombin III, protein C, protein S, factor V Leiden and prothrombin 2 gene mutation were all normal. Patient was started on short-term anticoagulation with eliquis which she took for a few months but discontinued it and change to aspirin 81 mg as she could not longer afford it. She states she's done well from a stroke and nerve facial droop and left-sided weakness and numbness has completely resolved. He complains of mild short-term memory difficulties but she is not sure these are related to the stroke. She denies any prior history of deep vein thrombosis, pulmonary embolism. She states her sister died at age 108 with a stroke but  she was a smoker, taking birth control pills and abusing drugs. The patient has stopped smoking since her stroke. She states she is eating healthy and trying to lose weight.  ROS:   14 system review of systems is positive for  appetite change, weight change, excessive sweating, flushing, nausea, frequent waking, memory loss, weakness, decreased concentration, anxiety, nervousness, walking difficulty, aching muscles, joint pain and all other systems negative  PMH:  Past Medical History:  Diagnosis Date  . Anxiety   . Stroke [facility])     Social History:  Social History   Social History  . Marital status: Divorced    Spouse name: N/A  . Number of children: N/A  . Years of education: N/A   Occupational History  . Not on file.   Social History Main Topics  . Smoking status: Former Smoker    Quit date: 11/10/2015  . Smokeless tobacco: Never Used  . Alcohol use 1.2 oz/week    1 Cans of beer, 1 Glasses of wine per week     Comment: occasionally.  . Drug use: No  . Sexual activity: Not on file   Other Topics Concern  . Not on file   Social History Narrative  . No narrative on file    Medications:   Current Outpatient Prescriptions on File Prior to Visit  Medication Sig Dispense Refill  . ALPRAZolam (XANAX) 0.5 MG tablet Take 1 tablet (0.5 mg total) by mouth 2 (two) times daily as needed for anxiety. 10 tablet 0   No  current facility-administered medications on file prior to visit.     Allergies:   Allergies  Allergen Reactions  . Anesthetics, Amide     Itching     Physical Exam General: Mildly obese young Caucasian lady, seated, in no evident distress Head: head normocephalic and atraumatic.  Neck: supple with no carotid or supraclavicular bruits Cardiovascular: regular rate and rhythm, no murmurs Musculoskeletal: no deformity Skin:  no rash/petichiae Vascular:  Normal pulses all extremities Vitals:   06/01/16 1110  BP: (!) 128/91  Pulse: 83   Neurologic  Exam Mental Status: Awake and fully alert. Oriented to place and time. Recent and remote memory intact. Attention span, concentration and fund of knowledge appropriate. Mood and affect appropriate.  Cranial Nerves: Fundoscopic exam reveals sharp disc margins. Pupils equal, briskly reactive to light. Extraocular movements full without nystagmus. Visual fields full to confrontation. Hearing intact. Facial sensation intact. Face, tongue, palate moves normally and symmetrically.  Motor: Normal bulk and tone. Normal strength in all tested extremity muscles. Sensory.: intact to touch ,pinprick .position and vibratory sensation.  Coordination: Rapid alternating movements normal in all extremities. Finger-to-nose and heel-to-shin performed accurately bilaterally. Gait and Station: Arises from chair without difficulty. Stance is normal. Gait demonstrates normal stride length and balance . Able to heel, toe and tandem walk without difficulty.  Reflexes: 1+ and symmetric. Toes downgoing.   NIHSS 0 Modified Rankin  0   ASSESSMENT: [age] year Caucasian lady with embolic right MCA infarct in April 2017 of cryptogenic etiology with extensive negative  workup. She is doing clinically quite well with practically no lasting deficits.. She has no significant risk factors except mild obesity and ex-smoking    PLAN: I had a long d/w patient about her recent cryptogenic stroke, risk for recurrent stroke/TIAs, personally independently reviewed imaging studies and stroke evaluation results and answered questions.Continue aspirin 81 mg daily  for secondary stroke prevention and maintain strict control of hypertension with blood pressure goal below 130/90, diabetes with hemoglobin A1c goal below 6.5% and lipids with LDL cholesterol goal below 70 mg/dL. I also advised the patient to eat a healthy diet with plenty of whole grains, cereals, fruits and vegetables, exercise regularly and maintain ideal body weight Greater than 50%  of time during this 25 minute visit was spent on counseling,explanation of diagnosis, planning of further management, discussion with patient and family and coordination of care.Followup in the future with me in  1 year or call earlier if necessary.  Delia HeadyPramod Ommie Degeorge, MD  [facility] [address] [place], [place] [id number]  Phone [phone] Fax [phone] Note: This document was prepared with digital dictation and possible smart phrase technology. Any transcriptional errors that result from this process are unintentional

## 2016-06-01 NOTE — Patient Instructions (Signed)
I had a long d/w patient about her recent cryptogenic stroke, risk for recurrent stroke/TIAs, personally independently reviewed imaging studies and stroke evaluation results and answered questions.Continue aspirin 81 mg daily  for secondary stroke prevention and maintain strict control of hypertension with blood pressure goal below 130/90, diabetes with hemoglobin A1c goal below 6.5% and lipids with LDL cholesterol goal below 70 mg/dL. I also advised the patient to eat a healthy diet with plenty of whole grains, cereals, fruits and vegetables, exercise regularly and maintain ideal body weight Followup in the future with me in  1 year or call earlier if necessary. Stroke Prevention Some medical conditions and behaviors are associated with an increased chance of having a stroke. You may prevent a stroke by making healthy choices and managing medical conditions. HOW CAN I REDUCE MY RISK OF HAVING A STROKE?   Stay physically active. Get at least 30 minutes of activity on most or all days.  Do not smoke. It may also be helpful to avoid exposure to secondhand smoke.  Limit alcohol use. Moderate alcohol use is considered to be:  No more than 2 drinks per day for men.  No more than 1 drink per day for nonpregnant women.  Eat healthy foods. This involves:  Eating 5 or more servings of fruits and vegetables a day.  Making dietary changes that address high blood pressure (hypertension), high cholesterol, diabetes, or obesity.  Manage your cholesterol levels.  Making food choices that are high in fiber and low in saturated fat, trans fat, and cholesterol may control cholesterol levels.  Take any prescribed medicines to control cholesterol as directed by your health care provider.  Manage your diabetes.  Controlling your carbohydrate and sugar intake is recommended to manage diabetes.  Take any prescribed medicines to control diabetes as directed by your health care provider.  Control your  hypertension.  Making food choices that are low in salt (sodium), saturated fat, trans fat, and cholesterol is recommended to manage hypertension.  Ask your health care provider if you need treatment to lower your blood pressure. Take any prescribed medicines to control hypertension as directed by your health care provider.  If you are 5418-41 years of age, have your blood pressure checked every 3-5 years. If you are 41 years of age or older, have your blood pressure checked every year.  Maintain a healthy weight.  Reducing calorie intake and making food choices that are low in sodium, saturated fat, trans fat, and cholesterol are recommended to manage weight.  Stop drug abuse.  Avoid taking birth control pills.  Talk to your health care provider about the risks of taking birth control pills if you are over 41 years old, smoke, get migraines, or have ever had a blood clot.  Get evaluated for sleep disorders (sleep apnea).  Talk to your health care provider about getting a sleep evaluation if you snore a lot or have excessive sleepiness.  Take medicines only as directed by your health care provider.  For some people, aspirin or blood thinners (anticoagulants) are helpful in reducing the risk of forming abnormal blood clots that can lead to stroke. If you have the irregular heart rhythm of atrial fibrillation, you should be on a blood thinner unless there is a good reason you cannot take them.  Understand all your medicine instructions.  Make sure that other conditions (such as anemia or atherosclerosis) are addressed. SEEK IMMEDIATE MEDICAL CARE IF:   You have sudden weakness or numbness of the face,  arm, or leg, especially on one side of the body.  Your face or eyelid droops to one side.  You have sudden confusion.  You have trouble speaking (aphasia) or understanding.  You have sudden trouble seeing in one or both eyes.  You have sudden trouble walking.  You have  dizziness.  You have a loss of balance or coordination.  You have a sudden, severe headache with no known cause.  You have new chest pain or an irregular heartbeat. Any of these symptoms may represent a serious problem that is an emergency. Do not wait to see if the symptoms will go away. Get medical help at once. Call your local emergency services (911 in [place] Do not drive yourself to the hospital.   This information is not intended to replace advice given to you by your health care provider. Make sure you discuss any questions you have with your health care provider.   Document Released: 09/02/2004 Document Revised: 08/16/2014 Document Reviewed: 01/26/2013 Elsevier Interactive Patient Education 2016 Elsevier Inc.   

## 2017-06-01 ENCOUNTER — Ambulatory Visit: Payer: Self-pay | Admitting: Neurology

## 2017-06-01 ENCOUNTER — Telehealth: Payer: Self-pay

## 2017-06-01 NOTE — Telephone Encounter (Signed)
Patient no show for appt today. 

## 2017-06-02 ENCOUNTER — Encounter: Payer: Self-pay | Admitting: Neurology

## 2018-02-22 IMAGING — CT CT ANGIO NECK
1 series · 1 of 2 positions shown · IV contrast (isovue)
Comparison: Prior brain MRI from 11/11/2015

ADDENDUM:
There is a dictation error in the impression portion of the
initially dictated report. Impression should state: Focal intimal
irregularity (not regularity).
CLINICAL DATA: Initial evaluation for acute stroke.

EXAM:
CT ANGIOGRAPHY HEAD AND NECK
TECHNIQUE: Multidetector CT imaging of the head and neck was performed using
the standard protocol during bolus administration of intravenous
contrast. Multiplanar CT image reconstructions and MIPs were
obtained to evaluate the vascular anatomy. Carotid stenosis
measurements (when applicable) are obtained utilizing NASCET
criteria, using the distal internal carotid diameter as the
denominator.
CONTRAST:  50 cc of Isovue 370.

[Series 100: scout · sagittal · 0.6mm · 0.98mm/px · 1 of 2 slices shown]
[im 2/2]
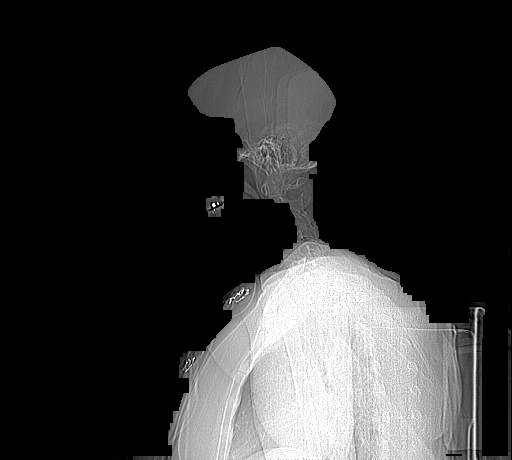

[1 of 2 positions shown; findings below may reference images not displayed]

FINDINGS: CT HEAD

Subtle evolving areas of acute right MCA territory infarcts are
seen, most evident within the right parietal lobe. These are better
evaluated on previous brain MRI. No significant mass effect or
associated hemorrhage. No other acute large vessel territory
infarct. Deep gray nuclei maintained. No acute intracranial
hemorrhage.

No mass lesion or midline shift. No mass effect. No hydrocephalus.
No extra-axial fluid collection.

Scalp soft tissues within normal limits. No acute abnormality about
the orbits.

Paranasal sinuses are clear.  No mastoid effusion.

Calvarium intact.

CTA NECK

Aortic arch: Visualized aortic arch of normal caliber with normal
branch pattern. No high-grade stenosis at the origin of the great
vessels. Visualized subclavian arteries well opacified and normal in
appearance.

Right carotid system: Right common carotid artery widely patent at
its origin. There is a focal filling defect arising from the medial
aspect of the right common carotid artery approximately 1.5-2 cm
proximal to the bifurcation (series 502, image 109). Intraluminal
filling defects within the medial aspect of the right common carotid
artery distal of ill, extending cephalad into the right carotid
bifurcation. Filling defect extends up to the level of the origin of
the right ICA. There is some extension into the proximal right ECA
(series 502, image 121). Given the somewhat long segment nature of
this finding, is small focal dissection is favored. There is mild to
moderate stenosis of up to approximately 50% at the most narrow
point. Distally, the right ICA is widely patent to the skullbase
without other intimal irregularity, dissection, or stenosis.

Left carotid system: Left common carotid artery patent from its
origin to the bifurcation. Left ICA widely patent from the
bifurcation to the skullbase. No dissection, stenosis, or vascular
occlusion within the left carotid artery system.

Vertebral arteries:Both vertebral arteries arise from the subclavian
arteries. Left vertebral artery dominant. Vertebral arteries widely
patent along their entire course without stenosis, dissection, or
occlusion.

Skeleton: No acute osseous abnormality. No worrisome lytic or
blastic osseous lesions.

Other neck: Visualized lungs are clear. Visualized superior
mediastinum within normal limits. Thyroid gland normal. No
adenopathy within the neck. No acute soft tissue abnormality.

CTA HEAD

Anterior circulation: The petrous, cavernous, and supraclinoid
segments of the ICAs are widely patent bilaterally. A1 segments,
anterior communicating artery, and anterior cerebral arteries well
opacified. M1 segments widely patent without stenosis or occlusion.
No proximal M2 branch occlusion. Distal MCA branches fairly
symmetric.

Posterior circulation: Left vertebral artery dominant and widely
patent to the vertebrobasilar junction. Diminutive right vertebral
artery patent as well. Posterior inferior cerebral arteries patent.
Basilar artery widely patent. Superior cerebral arteries patent
bilaterally. Both posterior cerebral arteries arise from the basilar
artery and are well opacified to their distal aspects.

Venous sinuses: Patent without evidence for venous sinus thrombosis.

Anatomic variants: No anatomic variant.  No aneurysm.

Delayed phase: Minimal leptomeningeal enhancement about the evolving
right MCA territory infarcts. No other pathologic enhancement.
IMPRESSION: 1. Focal intimal regularity at the medial aspect of the distal right
common carotid artery extending into the right carotid bifurcation,
likely reflecting a small focal dissection. Finding could certainly
represent an embolic source for patient's right MCA territory
infarcts.
2. Otherwise normal CTA of the head and neck.
3. Evolving ischemic right posterior MCA territory infarcts.
Results were called by telephone at the time of interpretation on
11/12/2015 at [DATE] to Dr. ADENYO, who verbally acknowledged these
results.

## 2021-04-15 ENCOUNTER — Other Ambulatory Visit: Payer: Self-pay

## 2021-04-15 ENCOUNTER — Encounter: Payer: Self-pay | Admitting: Registered Nurse

## 2021-04-15 ENCOUNTER — Ambulatory Visit (INDEPENDENT_AMBULATORY_CARE_PROVIDER_SITE_OTHER): Payer: Managed Care, Other (non HMO) | Admitting: Registered Nurse

## 2021-04-15 VITALS — BP 128/80 | HR 81 | Temp 98.2°F | Resp 20 | Ht 66.0 in | Wt 268.0 lb

## 2021-04-15 DIAGNOSIS — N911 Secondary amenorrhea: Secondary | ICD-10-CM

## 2021-04-15 DIAGNOSIS — M2550 Pain in unspecified joint: Secondary | ICD-10-CM | POA: Diagnosis not present

## 2021-04-15 DIAGNOSIS — N951 Menopausal and female climacteric states: Secondary | ICD-10-CM

## 2021-04-15 DIAGNOSIS — N924 Excessive bleeding in the premenopausal period: Secondary | ICD-10-CM

## 2021-04-15 DIAGNOSIS — R635 Abnormal weight gain: Secondary | ICD-10-CM

## 2021-04-15 LAB — CBC WITH DIFFERENTIAL/PLATELET
Basophils Absolute: 0.1 10*3/uL (ref 0.0–0.1)
Basophils Relative: 0.9 % (ref 0.0–3.0)
Eosinophils Absolute: 0.2 10*3/uL (ref 0.0–0.7)
Eosinophils Relative: 1.8 % (ref 0.0–5.0)
HCT: 45.6 % (ref 36.0–46.0)
Hemoglobin: 15.5 g/dL — ABNORMAL HIGH (ref 12.0–15.0)
Lymphocytes Relative: 32.5 % (ref 12.0–46.0)
Lymphs Abs: 3.2 10*3/uL (ref 0.7–4.0)
MCHC: 34 g/dL (ref 30.0–36.0)
MCV: 100.5 fl — ABNORMAL HIGH (ref 78.0–100.0)
Monocytes Absolute: 0.7 10*3/uL (ref 0.1–1.0)
Monocytes Relative: 6.5 % (ref 3.0–12.0)
Neutro Abs: 5.8 10*3/uL (ref 1.4–7.7)
Neutrophils Relative %: 58.3 % (ref 43.0–77.0)
Platelets: 355 10*3/uL (ref 150.0–400.0)
RBC: 4.54 Mil/uL (ref 3.87–5.11)
RDW: 13.1 % (ref 11.5–15.5)
WBC: 10 10*3/uL (ref 4.0–10.5)

## 2021-04-15 LAB — COMPREHENSIVE METABOLIC PANEL
ALT: 41 U/L — ABNORMAL HIGH (ref 0–35)
AST: 28 U/L (ref 0–37)
Albumin: 4.3 g/dL (ref 3.5–5.2)
Alkaline Phosphatase: 65 U/L (ref 39–117)
BUN: 10 mg/dL (ref 6–23)
CO2: 23 mEq/L (ref 19–32)
Calcium: 9.6 mg/dL (ref 8.4–10.5)
Chloride: 104 mEq/L (ref 96–112)
Creatinine, Ser: 0.61 mg/dL (ref 0.40–1.20)
GFR: 107.23 mL/min (ref >60.00)
Glucose, Bld: 80 mg/dL (ref 70–99)
Potassium: 4.2 mEq/L (ref 3.5–5.1)
Sodium: 139 mEq/L (ref 135–145)
Total Bilirubin: 0.8 mg/dL (ref 0.2–1.2)
Total Protein: 6.8 g/dL (ref 6.0–8.3)

## 2021-04-15 LAB — LIPID PANEL
Cholesterol: 184 mg/dL (ref 0–200)
HDL: 84.1 mg/dL (ref >39.00)
LDL Cholesterol: 84 mg/dL (ref 0–99)
NonHDL: 99.9
Total CHOL/HDL Ratio: 2
Triglycerides: 82 mg/dL (ref 0.0–149.0)
VLDL: 16.4 mg/dL (ref 0.0–40.0)

## 2021-04-15 LAB — HEMOGLOBIN A1C: Hgb A1c MFr Bld: 5.7 % (ref 4.6–6.5)

## 2021-04-15 LAB — TESTOSTERONE: Testosterone: 51.68 ng/dL — ABNORMAL HIGH (ref 15.00–40.00)

## 2021-04-15 LAB — SEDIMENTATION RATE: Sed Rate: 7 mm/hr (ref 0–20)

## 2021-04-15 LAB — C-REACTIVE PROTEIN: CRP: <1 mg/dL (ref 0.5–20.0)

## 2021-04-15 LAB — TSH: TSH: 5.04 u[IU]/mL (ref 0.35–5.50)

## 2021-04-15 NOTE — Progress Notes (Signed)
New Patient Office Visit  Subjective:  Patient ID: Gina Ayala, female    DOB: 1975-10-30  Age: 45 y.o. MRN: [id number]  CC:  Chief Complaint  Patient presents with   Establish Care    HPI Gina Ayala presents for visit to est care and menopause   Menopause Has a number of symptoms Falls asleep easy but does not stay asleep Swollen and tender joints, mostly R knee with pain Hot flashes on and off - had been on for a number of months Weight gain - 50lbs in past year LMP: 2-3 mo ago, heavy, painful, excessive bleeding This was the only cycle she has had this year.  Would like to have routine labs done today.  No other concerns.   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  Past Medical History:  Diagnosis Date   Anxiety    Stroke (HCC) 2017    Past Surgical History:  Procedure Laterality Date   CESAREAN SECTION     TEE WITHOUT CARDIOVERSION N/A 11/14/2015   Procedure: TRANSESOPHAGEAL ECHOCARDIOGRAM (TEE);  Surgeon: Pricilla Riffle, MD;  Location: [facility] ENDOSCOPY;  Service: Cardiovascular;  Laterality: N/A;    Family History  Problem Relation Age of Onset   Diabetes Mellitus II Mother    Hypertension Father    Stroke Sister    Drug abuse Sister     Social History   Socioeconomic History   Marital status: Divorced    Spouse name: Not on file   Number of children: Not on file   Years of education: Not on file   Highest education level: Not on file  Occupational History   Occupation: [employer]  Tobacco Use   Smoking status: Every Day    Packs/day: 0.25    Years: 26.00    Pack years: 6.50    Types: Cigarettes    Last attempt to quit: 11/10/2015    Years since quitting: 5.4   Smokeless tobacco: Never  Vaping Use   Vaping Use: Never used  Substance and Sexual Activity   Alcohol use: Yes    Alcohol/week: 2.0 standard drinks    Types: 1 Cans of beer, 1 Glasses of wine per week    Comment: occasionally.   Drug use: No   Sexual activity: Not  on file  Other Topics Concern   Not on file  Social History Narrative   Not on file   Social Determinants of Health   Financial Resource Strain: Not on file  Food Insecurity: Not on file  Transportation Needs: Not on file  Physical Activity: Not on file  Stress: Not on file  Social Connections: Not on file  Intimate Partner Violence: Not on file    ROS Review of Systems  Constitutional: Negative.   HENT: Negative.    Eyes: Negative.   Respiratory: Negative.    Cardiovascular: Negative.   Gastrointestinal: Negative.   Endocrine: Negative.   Genitourinary:  Positive for menstrual problem. Negative for decreased urine volume, difficulty urinating, dyspareunia, dysuria, enuresis, flank pain, frequency, genital sores, hematuria, pelvic pain, urgency, vaginal bleeding, vaginal discharge and vaginal pain.  Musculoskeletal:  Positive for arthralgias. Negative for back pain, gait problem, joint swelling, myalgias, neck pain and neck stiffness.  Skin: Negative.   Allergic/Immunologic: Negative.   Neurological: Negative.   Hematological: Negative.   Psychiatric/Behavioral: Negative.    All other systems reviewed and are negative.  Objective:   Today's Vitals: BP 128/80   Pulse 81  Temp 98.2 F (36.8 C) (Temporal)   Resp 20   Ht 5\' 6"  (1.676 m)   Wt 268 lb (121.6 kg)   SpO2 98%   BMI 43.26 kg/m   Physical Exam Vitals and nursing note reviewed.  Constitutional:      General: She is not in acute distress.    Appearance: Normal appearance. She is not ill-appearing, toxic-appearing or diaphoretic.  Cardiovascular:     Rate and Rhythm: Normal rate and regular rhythm.     Pulses: Normal pulses.     Heart sounds: Normal heart sounds. No murmur heard.   No friction rub. No gallop.  Pulmonary:     Effort: Pulmonary effort is normal. No respiratory distress.     Breath sounds: Normal breath sounds. No stridor. No wheezing, rhonchi or rales.  Chest:     Chest wall: No  tenderness.  Skin:    General: Skin is warm and dry.     Capillary Refill: Capillary refill takes less than 2 seconds.  Neurological:     General: No focal deficit present.     Mental Status: She is alert and oriented to person, place, and time. Mental status is at baseline.  Psychiatric:        Mood and Affect: Mood normal.        Behavior: Behavior normal.        Thought Content: Thought content normal.        Judgment: Judgment normal.    Assessment & Plan:   Problem List Items Addressed This Visit   None Visit Diagnoses     Menopausal symptoms    -  Primary   Relevant Orders   Comprehensive metabolic panel   FSH/LH   Anti-Mullerian Hormone (AMH), Female   C-reactive protein   Sedimentation rate   Arthralgia, unspecified joint       Relevant Orders   C-reactive protein   Sedimentation rate   Weight gain       Relevant Orders   Comprehensive metabolic panel   Hemoglobin A1c   Lipid panel   TSH   Estrogens, Total   Testosterone   Anti-Mullerian Hormone [facility]), Female   Excessive bleeding in premenopausal period       Relevant Orders   CBC with Differential/Platelet   Comprehensive metabolic panel   Iron, TIBC and Ferritin Panel   Secondary amenorrhea           Outpatient Encounter Medications as of 04/15/2021  Medication Sig   ALPRAZolam (XANAX) 0.5 MG tablet Take 1 tablet (0.5 mg total) by mouth 2 (two) times daily as needed for anxiety. (Patient taking differently: Take 0.5 mg by mouth 2 (two) times daily as needed for anxiety. Patient reports that she is taking 1mg  daily)   aspirin 81 MG tablet Take 81 mg by mouth daily.   Sertraline HCl (ZOLOFT PO) Take 200 mg by mouth.   No facility-administered encounter medications on file as of 04/15/2021.    Follow-up: Return if symptoms worsen or fail to improve, for TBD based on labs.   PLAN Suspect menopause. Though fairly early, not medically problematic if so - discussed this with patient who voices  understanding Help to confirm this and rule out further pathology with labs Routine check on sugars, lipids, metabolic panel, and more ordered Plan follow up based on lab results Can consider, based on labs, semaglutide or phentermine for weight loss if pt interested Patient encouraged to call clinic with any questions, comments, or concerns.  I spent 48 minutes with this patient in chart review, history taking, brief exam, coordinating labs, addressing acute and chronic symptoms, and coordinating follow up care. Janeece Agee, NP

## 2021-04-15 NOTE — Patient Instructions (Signed)
Ms. Gina Ayala to meet you. I'm looking forward to working with you.  Lots of labs today - I'll update you on results.  If labs are reassuring, can consider some medications that may help: Wellbutrin - help with anxiety, cigarette smoking, and potentially weight Phentermine - help with weight Semaglutide - help with weight, sugars, and keep heart and kidneys happy  Feel free to look into these. There are surely some horror stories on the internet but I am willing to provide my endorsement of these as safe and effective options (pending lab work!)  Thank you, [employer],   [employer]

## 2021-04-17 ENCOUNTER — Telehealth: Payer: Self-pay | Admitting: *Deleted

## 2021-04-17 NOTE — Telephone Encounter (Signed)
  Who's calling (name and relationship to patient) : Gina Ayala  Best contact number: [phone]  Provider they see: Janeece Agee, NP  Reason for call: Patient is calling to follow up on lab work performed on visit from 04/15/2021. Please call patient with results and plan.

## 2021-04-17 NOTE — Telephone Encounter (Signed)
Called patient and informed her that some of her test are pending results do to the fact they had to be sent to another lab. Also informed patient when we do get all her results in, we will give her a call. Patient voiced understnding.

## 2021-04-19 LAB — ANTI-MULLERIAN HORMONE (AMH), FEMALE: Anti-Mullerian Hormones(AMH), Female: 0.03 ng/mL

## 2021-04-21 ENCOUNTER — Other Ambulatory Visit: Payer: Self-pay | Admitting: Registered Nurse

## 2021-04-21 DIAGNOSIS — Z1231 Encounter for screening mammogram for malignant neoplasm of breast: Secondary | ICD-10-CM

## 2021-04-21 DIAGNOSIS — Z1211 Encounter for screening for malignant neoplasm of colon: Secondary | ICD-10-CM

## 2021-04-21 LAB — FSH/LH
FSH: 42.4 m[IU]/mL
LH: 45.3 m[IU]/mL

## 2021-04-21 LAB — ESTROGENS, TOTAL: Estrogen: 616.8 pg/mL — ABNORMAL HIGH

## 2021-04-21 LAB — IRON,TIBC AND FERRITIN PANEL
%SAT: 54 % (calc) — ABNORMAL HIGH (ref 16–45)
Ferritin: 48 ng/mL (ref 16–232)
Iron: 198 ug/dL — ABNORMAL HIGH (ref 40–190)
TIBC: 368 mcg/dL (calc) (ref 250–450)

## 2021-04-23 ENCOUNTER — Telehealth: Payer: Self-pay | Admitting: Registered Nurse

## 2021-04-23 NOTE — Telephone Encounter (Signed)
Called and spoke with patient about labs. I don't see anything scheduled in imaging in her chart for a pelvic ultrasound. Please advise

## 2021-04-23 NOTE — Telephone Encounter (Signed)
Patient called and would like someone to call her about the mammogram ordered and the colon cancer screening

## 2021-04-24 ENCOUNTER — Other Ambulatory Visit: Payer: Self-pay | Admitting: Registered Nurse

## 2021-04-24 DIAGNOSIS — N926 Irregular menstruation, unspecified: Secondary | ICD-10-CM

## 2021-04-24 NOTE — Telephone Encounter (Signed)
Called patient and left message on vm about ultrasound.

## 2021-04-24 NOTE — Telephone Encounter (Signed)
Apologies -   Order placed to [facility] [address]  Thanks,  Retail banker

## 2021-07-20 ENCOUNTER — Encounter: Payer: Self-pay | Admitting: Registered Nurse

## 2022-01-13 ENCOUNTER — Other Ambulatory Visit (HOSPITAL_BASED_OUTPATIENT_CLINIC_OR_DEPARTMENT_OTHER): Payer: Self-pay | Admitting: Nurse Practitioner

## 2022-01-13 DIAGNOSIS — E063 Autoimmune thyroiditis: Secondary | ICD-10-CM

## 2022-01-18 DIAGNOSIS — E063 Autoimmune thyroiditis: Secondary | ICD-10-CM | POA: Insufficient documentation

## 2022-02-01 ENCOUNTER — Telehealth (HOSPITAL_BASED_OUTPATIENT_CLINIC_OR_DEPARTMENT_OTHER): Payer: Self-pay

## 2023-03-31 ENCOUNTER — Ambulatory Visit (INDEPENDENT_AMBULATORY_CARE_PROVIDER_SITE_OTHER): Payer: Managed Care, Other (non HMO) | Admitting: Family Medicine

## 2023-03-31 ENCOUNTER — Encounter: Payer: Self-pay | Admitting: Family Medicine

## 2023-03-31 DIAGNOSIS — Z8639 Personal history of other endocrine, nutritional and metabolic disease: Secondary | ICD-10-CM | POA: Diagnosis not present

## 2023-03-31 DIAGNOSIS — F419 Anxiety disorder, unspecified: Secondary | ICD-10-CM | POA: Diagnosis not present

## 2023-03-31 DIAGNOSIS — E559 Vitamin D deficiency, unspecified: Secondary | ICD-10-CM | POA: Diagnosis not present

## 2023-03-31 DIAGNOSIS — Z124 Encounter for screening for malignant neoplasm of cervix: Secondary | ICD-10-CM

## 2023-03-31 DIAGNOSIS — R7303 Prediabetes: Secondary | ICD-10-CM | POA: Diagnosis not present

## 2023-03-31 DIAGNOSIS — F32A Depression, unspecified: Secondary | ICD-10-CM

## 2023-03-31 DIAGNOSIS — N939 Abnormal uterine and vaginal bleeding, unspecified: Secondary | ICD-10-CM

## 2023-03-31 DIAGNOSIS — F429 Obsessive-compulsive disorder, unspecified: Secondary | ICD-10-CM

## 2023-03-31 LAB — CBC WITH DIFFERENTIAL/PLATELET
Basophils Absolute: 0.1 10*3/uL (ref 0.0–0.1)
Basophils Relative: 0.9 % (ref 0.0–3.0)
Eosinophils Absolute: 0.2 10*3/uL (ref 0.0–0.7)
Eosinophils Relative: 1.6 % (ref 0.0–5.0)
HCT: 52.3 % — ABNORMAL HIGH (ref 36.0–46.0)
Hemoglobin: 17.2 g/dL — ABNORMAL HIGH (ref 12.0–15.0)
Lymphocytes Relative: 24.9 % (ref 12.0–46.0)
Lymphs Abs: 2.5 10*3/uL (ref 0.7–4.0)
MCHC: 33 g/dL (ref 30.0–36.0)
MCV: 102.6 fl — ABNORMAL HIGH (ref 78.0–100.0)
Monocytes Absolute: 0.6 10*3/uL (ref 0.1–1.0)
Monocytes Relative: 6.4 % (ref 3.0–12.0)
Neutro Abs: 6.5 10*3/uL (ref 1.4–7.7)
Neutrophils Relative %: 66.2 % (ref 43.0–77.0)
Platelets: 320 10*3/uL (ref 150.0–400.0)
RBC: 5.1 Mil/uL (ref 3.87–5.11)
RDW: 14 % (ref 11.5–15.5)
WBC: 9.9 10*3/uL (ref 4.0–10.5)

## 2023-03-31 LAB — LIPID PANEL
Cholesterol: 191 mg/dL (ref 0–200)
HDL: 60.4 mg/dL (ref >39.00)
LDL Cholesterol: 112 mg/dL — ABNORMAL HIGH (ref 0–99)
NonHDL: 130.99
Total CHOL/HDL Ratio: 3
Triglycerides: 94 mg/dL (ref 0.0–149.0)
VLDL: 18.8 mg/dL (ref 0.0–40.0)

## 2023-03-31 LAB — COMPREHENSIVE METABOLIC PANEL
ALT: 38 U/L — ABNORMAL HIGH (ref 0–35)
AST: 27 U/L (ref 0–37)
Albumin: 4.2 g/dL (ref 3.5–5.2)
Alkaline Phosphatase: 71 U/L (ref 39–117)
BUN: 14 mg/dL (ref 6–23)
CO2: 28 meq/L (ref 19–32)
Calcium: 9.5 mg/dL (ref 8.4–10.5)
Chloride: 104 meq/L (ref 96–112)
Creatinine, Ser: 0.81 mg/dL (ref 0.40–1.20)
GFR: 85.88 mL/min (ref >60.00)
Glucose, Bld: 100 mg/dL — ABNORMAL HIGH (ref 70–99)
Potassium: 4.5 meq/L (ref 3.5–5.1)
Sodium: 141 mEq/L (ref 135–145)
Total Bilirubin: 0.4 mg/dL (ref 0.2–1.2)
Total Protein: 6.9 g/dL (ref 6.0–8.3)

## 2023-03-31 LAB — HEMOGLOBIN A1C: Hgb A1c MFr Bld: 5.5 % (ref 4.6–6.5)

## 2023-03-31 NOTE — Patient Instructions (Signed)
Thank you for trusting [place] with your health care.   Please go downstairs for labs before you leave.  We will be in touch with the results and with recommendations.  You should hear from an OB/GYN office in the next week.  Please schedule a preventive health care visit at your convenience.

## 2023-03-31 NOTE — Progress Notes (Signed)
New Patient Office Visit  Subjective    Patient ID: Gina Ayala, female    DOB: 28-Nov-1975  Age: 47 y.o. MRN: [id number]  CC:  Chief Complaint  Patient presents with   Establish Care    No care the last 2 years due to insurance    HPI Gina Ayala presents to establish care  No PCP for 2 years   Sees Dr. Evelene Croon- psychiatrist   States she was going to [facility]  Last seen there in 06/2022  Hx of stroke in 2017. No clear etiology   Took Eliquis 6 months. Now takes ASA 81 mg daily.  Zero deficits  States she was cleared by neurologist.    Hx of ovarian cysts and AUB. Has one period approx every 11 months.   States she has gained over 100 lbs over the past 3 years. Having knee pain and emotional about weight.    She has lived with significant other for 19 years.  1 son. Has a grandson who is 4 months.  Works at [employer].  Walks some at work.       03/31/2023   10:03 AM 04/15/2021    1:41 PM  Depression screen PHQ 2/9  Decreased Interest 3 1  Down, Depressed, Hopeless 2 0  PHQ - 2 Score 5 1  Altered sleeping 3 2  Tired, decreased energy 3 3  Change in appetite 1 1  Feeling bad or failure about yourself  2 0  Trouble concentrating 1 0  Moving slowly or fidgety/restless 0 0  Suicidal thoughts 0 0  PHQ-9 Score 15 7  Difficult doing work/chores Very difficult Somewhat difficult      Outpatient Encounter Medications as of 03/31/2023  Medication Sig   ALPRAZolam (XANAX) 0.5 MG tablet Take 1 tablet (0.5 mg total) by mouth 2 (two) times daily as needed for anxiety. (Patient taking differently: Take 0.5 mg by mouth 2 (two) times daily as needed for anxiety. Patient reports that she is taking 1mg  daily)   aspirin 81 MG tablet Take 81 mg by mouth daily.   NP THYROID 60 MG tablet Take 60 mg by mouth every morning.   sertraline (ZOLOFT) 100 MG tablet Take 200 mg by mouth daily.   progesterone (PROMETRIUM) 100 MG capsule Take 100 mg  by mouth daily. (Patient not taking: Reported on 03/31/2023)   [DISCONTINUED] Sertraline HCl (ZOLOFT PO) Take 200 mg by mouth.   No facility-administered encounter medications on file as of 03/31/2023.    Past Medical History:  Diagnosis Date   Allergy 05/23/1997   Anesthesia   Anxiety    Depression    Stroke [facility] 2017   Thyroid disease 01/2022   Hashimoto's    Past Surgical History:  Procedure Laterality Date   CESAREAN SECTION     TEE WITHOUT CARDIOVERSION N/A 11/14/2015   Procedure: TRANSESOPHAGEAL ECHOCARDIOGRAM (TEE);  Surgeon: Pricilla Riffle, MD;  Location: [facility] ENDOSCOPY;  Service: Cardiovascular;  Laterality: N/A;    Family History  Problem Relation Age of Onset   Diabetes Mellitus II Mother    Diabetes Mother    Hypertension Father    Stroke Sister    Drug abuse Sister    Diabetes Maternal Grandmother    Early death Sister     Social History   Socioeconomic History   Marital status: Divorced    Spouse name: Not on file   Number of children: Not on file   Years of education:  Not on file   Highest education level: Not on file  Occupational History   Occupation: Editor, commissioning  Tobacco Use   Smoking status: Light Smoker    Current packs/day: 0.00    Average packs/day: 0.3 packs/day for 26.0 years (6.5 ttl pk-yrs)    Types: Cigarettes    Start date: 11/09/1989    Last attempt to quit: 11/10/2015    Years since quitting: 7.3   Smokeless tobacco: Never  Vaping Use   Vaping status: Never Used  Substance and Sexual Activity   Alcohol use: Yes    Alcohol/week: 4.0 standard drinks of alcohol    Types: 4 Standard drinks or equivalent per week    Comment: occasionally.   Drug use: No   Sexual activity: Yes    Birth control/protection: Condom  Other Topics Concern   Not on file  Social History Narrative   Not on file   Social Determinants of Health   Financial Resource Strain: Not on file  Food Insecurity: Not on file  Transportation Needs: Not on file  Physical  Activity: Not on file  Stress: Not on file  Social Connections: Not on file  Intimate Partner Violence: Not on file    Review of Systems  Constitutional:  Negative for chills and fever.  Respiratory:  Negative for shortness of breath.   Cardiovascular:  Negative for chest pain, palpitations and leg swelling.  Gastrointestinal:  Negative for abdominal pain, constipation, diarrhea, nausea and vomiting.  Genitourinary:  Negative for dysuria, frequency and urgency.  Musculoskeletal:  Positive for joint pain.  Neurological:  Negative for dizziness and focal weakness.  Psychiatric/Behavioral:  Positive for depression. The patient is nervous/anxious.         Objective    BP 126/82 (BP Location: Left Arm, Patient Position: Sitting, Cuff Size: Large)   Pulse 88   Temp (!) 97.3 F (36.3 C) (Temporal)   Ht 5' 6.5" (1.689 m)   Wt 291 lb (132 kg)   SpO2 97%   BMI 46.26 kg/m   Physical Exam Constitutional:      General: She is not in acute distress.    Appearance: She is not ill-appearing.  HENT:     Mouth/Throat:     Pharynx: Oropharynx is clear.  Eyes:     Extraocular Movements: Extraocular movements intact.     Conjunctiva/sclera: Conjunctivae normal.  Cardiovascular:     Rate and Rhythm: Normal rate.  Pulmonary:     Effort: Pulmonary effort is normal.  Musculoskeletal:     Cervical back: Normal range of motion and neck supple.  Skin:    General: Skin is warm and dry.  Neurological:     General: No focal deficit present.     Mental Status: She is alert and oriented to person, place, and time.  Psychiatric:        Attention and Perception: Attention normal.        Mood and Affect: Mood normal. Affect is tearful.        Speech: Speech normal.        Behavior: Behavior normal.        Thought Content: Thought content normal. Thought content does not include suicidal ideation.         Assessment & Plan:   Problem List Items Addressed This Visit       Other    Severe obesity (BMI >= 40) (HCC) - Primary   Relevant Orders   CBC with Differential/Platelet   Comprehensive metabolic panel  Lipid panel   Other Visit Diagnoses     History of thyroid disorder       Relevant Orders   TSH   T4, free   T3, free   Thyroid antibodies   Anxiety and depression       Relevant Medications   sertraline (ZOLOFT) 100 MG tablet   Obsessive-compulsive disorder, unspecified type       Vitamin D deficiency       Relevant Orders   VITAMIN D 25 Hydroxy (Vit-D Deficiency, Fractures)   Prediabetes       Relevant Orders   Comprehensive metabolic panel   Hemoglobin A1c   Screening for cervical cancer       Relevant Orders   Ambulatory referral to Obstetrics / Gynecology   Abnormal uterine bleeding (AUB)       Relevant Orders   Ambulatory referral to Obstetrics / Gynecology      Here to establish care.  Sees Dr. Evelene Croon - psychiatrist.  Referral to OB/GYN Check labs to follow up on prediabetes, thyroid disorder and to look for complications from obesity. Consider starting GLP-1. Consider referral to [facility].  Will need GI referral.    Return for CPE f/u .   Hetty Blend, NP-C

## 2023-04-01 LAB — VITAMIN D 25 HYDROXY (VIT D DEFICIENCY, FRACTURES): VITD: 23.47 ng/mL — ABNORMAL LOW (ref 30.00–100.00)

## 2023-04-01 LAB — TSH: TSH: 4.06 u[IU]/mL (ref 0.35–5.50)

## 2023-04-01 LAB — T4, FREE: Free T4: 0.52 ng/dL — ABNORMAL LOW (ref 0.60–1.60)

## 2023-04-01 LAB — T3, FREE: T3, Free: 3.3 pg/mL (ref 2.3–4.2)

## 2023-04-04 LAB — THYROID ANTIBODIES: Thyroperoxidase Ab SerPl-aCnc: >900 IU/mL — ABNORMAL HIGH (ref <9)

## 2023-04-04 LAB — THYROID ANTIBODIES (THYROPEROXIDASE & THYROGLOBULIN): Thyroglobulin Ab: >1000 [IU]/mL — ABNORMAL HIGH (ref <=1)

## 2023-04-04 NOTE — Progress Notes (Signed)
Please check and see if her thyroid antibody lab test was collected. It still says expected. Thanks.

## 2023-04-05 NOTE — Progress Notes (Signed)
Her labs confirm that she has Hashimotos thyroid disease. Please ask her if she has been out of thyroid medication and if she plans to continue NP Armour or if she would like to switch to levothyroxine. Has she ever taken levothyroxine?  Her vitamin D level is low. I recommend taking OTC vitamin D3 2,000 IUs daily.  One liver enzyme is just marginally elevated. Limit or avoid alcohol and OTC NSAIDs.  LDL (the bad cholesterol) is slightly elevated. Cut back on saturated fat, processed foods and increase physical activity as tolerated.  Follow up in office in 8 weeks.

## 2023-04-08 ENCOUNTER — Encounter: Payer: Self-pay | Admitting: Family Medicine

## 2023-04-12 NOTE — Telephone Encounter (Signed)
Pt f/u from lab results 8/22

## 2023-05-26 ENCOUNTER — Ambulatory Visit (INDEPENDENT_AMBULATORY_CARE_PROVIDER_SITE_OTHER): Payer: Managed Care, Other (non HMO) | Admitting: Family Medicine

## 2023-05-26 ENCOUNTER — Encounter: Payer: Self-pay | Admitting: Family Medicine

## 2023-05-26 VITALS — BP 126/84 | HR 88 | Temp 97.3°F | Ht 66.5 in | Wt 291.0 lb

## 2023-05-26 DIAGNOSIS — Z1211 Encounter for screening for malignant neoplasm of colon: Secondary | ICD-10-CM

## 2023-05-26 DIAGNOSIS — R0683 Snoring: Secondary | ICD-10-CM

## 2023-05-26 DIAGNOSIS — E063 Autoimmune thyroiditis: Secondary | ICD-10-CM

## 2023-05-26 DIAGNOSIS — R5383 Other fatigue: Secondary | ICD-10-CM | POA: Diagnosis not present

## 2023-05-26 DIAGNOSIS — N939 Abnormal uterine and vaginal bleeding, unspecified: Secondary | ICD-10-CM

## 2023-05-26 DIAGNOSIS — D751 Secondary polycythemia: Secondary | ICD-10-CM | POA: Diagnosis not present

## 2023-05-26 DIAGNOSIS — E559 Vitamin D deficiency, unspecified: Secondary | ICD-10-CM

## 2023-05-26 DIAGNOSIS — R748 Abnormal levels of other serum enzymes: Secondary | ICD-10-CM | POA: Diagnosis not present

## 2023-05-26 DIAGNOSIS — Z8742 Personal history of other diseases of the female genital tract: Secondary | ICD-10-CM

## 2023-05-26 DIAGNOSIS — G478 Other sleep disorders: Secondary | ICD-10-CM

## 2023-05-26 LAB — CBC WITH DIFFERENTIAL/PLATELET
Basophils Absolute: 0.1 10*3/uL (ref 0.0–0.1)
Basophils Relative: 1.1 % (ref 0.0–3.0)
Eosinophils Absolute: 0.2 10*3/uL (ref 0.0–0.7)
Eosinophils Relative: 2 % (ref 0.0–5.0)
HCT: 53.2 % — ABNORMAL HIGH (ref 36.0–46.0)
Hemoglobin: 17.6 g/dL — ABNORMAL HIGH (ref 12.0–15.0)
Lymphocytes Relative: 27.7 % (ref 12.0–46.0)
Lymphs Abs: 2.9 10*3/uL (ref 0.7–4.0)
MCHC: 33.2 g/dL (ref 30.0–36.0)
MCV: 101.4 fL — ABNORMAL HIGH (ref 78.0–100.0)
Monocytes Absolute: 0.7 10*3/uL (ref 0.1–1.0)
Monocytes Relative: 7.1 % (ref 3.0–12.0)
Neutro Abs: 6.5 10*3/uL (ref 1.4–7.7)
Neutrophils Relative %: 62.1 % (ref 43.0–77.0)
Platelets: 359 10*3/uL (ref 150.0–400.0)
RBC: 5.25 Mil/uL — ABNORMAL HIGH (ref 3.87–5.11)
RDW: 14.1 % (ref 11.5–15.5)
WBC: 10.5 10*3/uL (ref 4.0–10.5)

## 2023-05-26 LAB — BASIC METABOLIC PANEL
BUN: 13 mg/dL (ref 6–23)
CO2: 25 meq/L (ref 19–32)
Calcium: 9.9 mg/dL (ref 8.4–10.5)
Chloride: 106 meq/L (ref 96–112)
Creatinine, Ser: 0.73 mg/dL (ref 0.40–1.20)
GFR: 97.19 mL/min (ref >60.00)
Glucose, Bld: 112 mg/dL — ABNORMAL HIGH (ref 70–99)
Potassium: 4.3 meq/L (ref 3.5–5.1)
Sodium: 141 meq/L (ref 135–145)

## 2023-05-26 LAB — VITAMIN B12: Vitamin B-12: 385 pg/mL (ref 211–911)

## 2023-05-26 LAB — HEPATIC FUNCTION PANEL
ALT: 41 U/L — ABNORMAL HIGH (ref 0–35)
AST: 31 U/L (ref 0–37)
Albumin: 4.4 g/dL (ref 3.5–5.2)
Alkaline Phosphatase: 70 U/L (ref 39–117)
Bilirubin, Direct: 0.1 mg/dL (ref 0.0–0.3)
Total Bilirubin: 0.4 mg/dL (ref 0.2–1.2)
Total Protein: 7.1 g/dL (ref 6.0–8.3)

## 2023-05-26 LAB — TSH: TSH: 3.3 u[IU]/mL (ref 0.35–5.50)

## 2023-05-26 LAB — T4, FREE: Free T4: 0.5 ng/dL — ABNORMAL LOW (ref 0.60–1.60)

## 2023-05-26 LAB — FOLATE: Folate: 21.3 ng/mL (ref >5.9)

## 2023-05-26 MED ORDER — LEVOTHYROXINE SODIUM 88 MCG PO TABS
88.0000 ug | ORAL_TABLET | Freq: Every day | ORAL | 1 refills | Status: AC
Start: 2023-05-26 — End: ?

## 2023-05-26 NOTE — Assessment & Plan Note (Signed)
Possibly due to smoking. May have sleep apnea. HST ordered. Follow pending lab results.

## 2023-05-26 NOTE — Assessment & Plan Note (Signed)
HST ordered.

## 2023-05-26 NOTE — Assessment & Plan Note (Signed)
Avoid alcohol and hepatotoxic drugs. Weight loss will help. Check hepatic function.

## 2023-05-26 NOTE — Assessment & Plan Note (Signed)
States her insurance will not cover GLP-1s.  Denies overeating.  Follow up in 6 weeks.

## 2023-05-26 NOTE — Assessment & Plan Note (Signed)
Most likely multifactorial. Start levothyroxine. Follow up 6 wks.

## 2023-05-26 NOTE — Progress Notes (Signed)
Subjective:     Patient ID: Gina Ayala, female    DOB: 05/02/1976, 47 y.o.   MRN: [id number]  Chief Complaint  Patient presents with   Medical Management of Chronic Issues    HPI   History of Present Illness         Here to follow up on abnormal labs.  States she feels "miserable all the time. States she is uncomfortable in her own skin due to obesity.  States her eating does not explain her weight gain. Denies eating excessive calories.   Polycythemia - smokes. Never been tested for sleep panea.   Snores.  Non restorative sleep.  Wake up several times per night. Sometimes feels a sense of panic.    Anxiety and depression- worsening  On sertraline and alprazolam.   [facility]   Sees Dr. Evelene Croon- psychiatrist    States she was going to [facility]  Last seen there in 06/2022   Hx of stroke in 2017. No clear etiology    Took Eliquis 6 months. Now takes ASA 81 mg daily.  Zero deficits  States she was cleared by neurologist.      Hx of ovarian cysts and AUB. Has one period approx every 11 months.    States she has gained over 100 lbs over the past 3 years. Having knee pain and emotional about weight.         Health Maintenance Due  Topic Date Due   HIV Screening  Never done   DTaP/Tdap/Td (1 - Tdap) Never done   Cervical Cancer Screening (HPV/Pap Cotest)  Never done   Colonoscopy  Never done    Past Medical History:  Diagnosis Date   Allergy 05/23/1997   Anesthesia   Anxiety    Depression    Stroke [facility]) 2017   Thyroid disease 01/2022   Hashimoto's    Past Surgical History:  Procedure Laterality Date   CESAREAN SECTION     TEE WITHOUT CARDIOVERSION N/A 11/14/2015   Procedure: TRANSESOPHAGEAL ECHOCARDIOGRAM (TEE);  Surgeon: Pricilla Riffle, MD;  Location: [facility] ENDOSCOPY;  Service: Cardiovascular;  Laterality: N/A;    Family History  Problem Relation Age of Onset   Diabetes Mellitus II  Mother    Diabetes Mother    Hypertension Father    Stroke Sister    Drug abuse Sister    Diabetes Maternal Grandmother    Early death Sister     Social History   Socioeconomic History   Marital status: Divorced    Spouse name: Not on file   Number of children: Not on file   Years of education: Not on file   Highest education level: Not on file  Occupational History   Occupation: [employer]  Tobacco Use   Smoking status: Light Smoker    Current packs/day: 0.00    Average packs/day: 0.3 packs/day for 26.0 years (6.5 ttl pk-yrs)    Types: Cigarettes    Start date: 11/09/1989    Last attempt to quit: 11/10/2015    Years since quitting: 7.5   Smokeless tobacco: Never  Vaping Use   Vaping status: Never Used  Substance and Sexual Activity   Alcohol use: Yes    Alcohol/week: 4.0 standard drinks of alcohol    Types: 4 Standard drinks or equivalent per week    Comment: occasionally.   Drug use: No   Sexual activity: Yes    Birth control/protection: Condom  Other Topics Concern  Not on file  Social History Narrative   Not on file   Social Determinants of Health   Financial Resource Strain: Not on file  Food Insecurity: Not on file  Transportation Needs: Not on file  Physical Activity: Not on file  Stress: Not on file  Social Connections: Not on file  Intimate Partner Violence: Not on file    Outpatient Medications Prior to Visit  Medication Sig Dispense Refill   ALPRAZolam (XANAX) 1 MG tablet Take 1 mg by mouth 4 (four) times daily as needed.     aspirin 81 MG tablet Take 81 mg by mouth daily.     sertraline (ZOLOFT) 100 MG tablet Take 200 mg by mouth daily.     ALPRAZolam (XANAX) 0.5 MG tablet Take 1 tablet (0.5 mg total) by mouth 2 (two) times daily as needed for anxiety. (Patient taking differently: Take 0.5 mg by mouth 2 (two) times daily as needed for anxiety. Patient reports that she is taking 1mg  daily) 10 tablet 0   NP THYROID 60 MG tablet Take 60 mg by mouth  every morning.     progesterone (PROMETRIUM) 100 MG capsule Take 100 mg by mouth daily. (Patient not taking: Reported on 03/31/2023)     No facility-administered medications prior to visit.    Allergies  Allergen Reactions   Anesthetics, Amide     Itching     Review of Systems  Constitutional:  Positive for malaise/fatigue. Negative for chills, fever and weight loss.  Respiratory:  Negative for shortness of breath.   Cardiovascular:  Negative for chest pain, palpitations and leg swelling.  Gastrointestinal:  Negative for abdominal pain, constipation, diarrhea, nausea and vomiting.  Genitourinary:  Negative for dysuria, frequency and urgency.  Neurological:  Negative for dizziness and focal weakness.  Psychiatric/Behavioral:  Positive for depression. Negative for suicidal ideas. The patient is nervous/anxious and has insomnia.        Objective:    Physical Exam Constitutional:      General: She is not in acute distress.    Appearance: She is obese. She is not ill-appearing.  Eyes:     Extraocular Movements: Extraocular movements intact.     Conjunctiva/sclera: Conjunctivae normal.  Neck:     Thyroid: No thyromegaly or thyroid tenderness.  Cardiovascular:     Rate and Rhythm: Normal rate and regular rhythm.  Pulmonary:     Effort: Pulmonary effort is normal.     Breath sounds: Normal breath sounds.  Musculoskeletal:     Cervical back: Normal range of motion and neck supple.  Lymphadenopathy:     Cervical: No cervical adenopathy.  Skin:    General: Skin is warm and dry.  Neurological:     General: No focal deficit present.     Mental Status: She is alert and oriented to person, place, and time.  Psychiatric:        Mood and Affect: Mood normal.        Behavior: Behavior normal.        Thought Content: Thought content normal.      BP 126/84 (BP Location: Left Arm, Patient Position: Sitting, Cuff Size: Large)   Pulse 88   Temp (!) 97.3 F (36.3 C) (Temporal)    Ht 5' 6.5" (1.689 m)   Wt 291 lb (132 kg)   SpO2 95%   BMI 46.26 kg/m  Wt Readings from Last 3 Encounters:  05/26/23 291 lb (132 kg)  03/31/23 291 lb (132 kg)  04/15/21 268 lb (  121.6 kg)       Assessment & Plan:   Problem List Items Addressed This Visit       Endocrine   Hashimoto's disease - Primary    Switch to levothyroxine 88 mcg. Follow up in 6 wks.       Relevant Medications   levothyroxine (SYNTHROID) 88 MCG tablet   Other Relevant Orders   TSH (Completed)   T4, free (Completed)     Nervous and Auditory   Non-restorative sleep    HST ordered       Relevant Orders   PSG SLEEP STUDY     Genitourinary   Abnormal uterine bleeding (AUB)    Referral to gyn      Relevant Orders   Ambulatory referral to Gynecology     Other   Elevated liver enzymes    Avoid alcohol and hepatotoxic drugs. Weight loss will help. Check hepatic function.       Relevant Orders   Basic metabolic panel (Completed)   Hepatic function panel (Completed)   Iron, TIBC and Ferritin Panel   Fatigue    Most likely multifactorial. Start levothyroxine. Follow up 6 wks.       Relevant Orders   CBC with Differential/Platelet (Completed)   Basic metabolic panel (Completed)   Hepatic function panel (Completed)   TSH (Completed)   T4, free (Completed)   Folate (Completed)   Iron, TIBC and Ferritin Panel   Vitamin B12 (Completed)   PSG SLEEP STUDY   History of ovarian cyst   Relevant Orders   Ambulatory referral to Gynecology   Polycythemia    Possibly due to smoking. May have sleep apnea. HST ordered. Follow pending lab results.       Relevant Orders   CBC with Differential/Platelet (Completed)   Folate (Completed)   Iron, TIBC and Ferritin Panel   Vitamin B12 (Completed)   Severe obesity (BMI >= 40) (HCC)    States her insurance will not cover GLP-1s.  Denies overeating.  Follow up in 6 weeks.       Relevant Orders   PSG SLEEP STUDY   Snores    HST ordered        Relevant Orders   PSG SLEEP STUDY   Vitamin D deficiency   Other Visit Diagnoses     Screen for colon cancer       Relevant Orders   Cologuard       I have discontinued Carnita L. Heinze's NP Thyroid. I am also having her start on levothyroxine. Additionally, I am having her maintain her aspirin, progesterone, sertraline, and ALPRAZolam.  Meds ordered this encounter  Medications   levothyroxine (SYNTHROID) 88 MCG tablet    Sig: Take 1 tablet (88 mcg total) by mouth daily.    Dispense:  30 tablet    Refill:  1    Order Specific Question:   Supervising Provider    Answer:   Hillard Danker A [[id number]]

## 2023-05-26 NOTE — Assessment & Plan Note (Signed)
HST ordered.

## 2023-05-26 NOTE — Assessment & Plan Note (Signed)
Referral to gyn

## 2023-05-26 NOTE — Patient Instructions (Addendum)
Please go downstairs for labs.  Stop NP Armour Thyroid medication and start levothyroxine 88 mcg daily. Make sure you take the medication on empty stomach.  Only with water.  Avoid eating or drinking any other beverages for at least 45 minutes.  You will hear from the sleep center regarding a home sleep test.  This is to screen you for sleep apnea.  You should hear from gynecology office in the next 2 weeks.  Let me know if you do not.  Work on stopping smoking.  Follow-up here in 6 weeks.  You do not have to be fasting.

## 2023-05-26 NOTE — Assessment & Plan Note (Signed)
Switch to levothyroxine 88 mcg. Follow up in 6 wks.

## 2023-05-27 LAB — IRON,TIBC AND FERRITIN PANEL
%SAT: 31 % (ref 16–45)
Ferritin: 36 ng/mL (ref 16–232)
Iron: 104 ug/dL (ref 40–190)
TIBC: 336 ug/dL (ref 250–450)

## 2023-06-01 ENCOUNTER — Other Ambulatory Visit: Payer: Self-pay | Admitting: Family Medicine

## 2023-06-01 DIAGNOSIS — Z8673 Personal history of transient ischemic attack (TIA), and cerebral infarction without residual deficits: Secondary | ICD-10-CM

## 2023-06-01 DIAGNOSIS — D751 Secondary polycythemia: Secondary | ICD-10-CM

## 2023-06-01 NOTE — Progress Notes (Signed)
Please let her know that I am referring her to a blood specialist called a hematologist for her elevated hemoglobin. She will receive a call to schedule. She had normal iron levels, vitamin B12, and folate levels.

## 2023-06-25 ENCOUNTER — Inpatient Hospital Stay: Payer: Managed Care, Other (non HMO) | Attending: Nurse Practitioner | Admitting: Nurse Practitioner

## 2023-06-25 ENCOUNTER — Encounter: Payer: Self-pay | Admitting: Nurse Practitioner

## 2023-06-25 ENCOUNTER — Inpatient Hospital Stay: Payer: Managed Care, Other (non HMO)

## 2023-06-25 VITALS — BP 150/105 | HR 92 | Temp 98.0°F | Resp 18 | Wt 295.4 lb

## 2023-06-25 DIAGNOSIS — D751 Secondary polycythemia: Secondary | ICD-10-CM

## 2023-06-25 DIAGNOSIS — Z8673 Personal history of transient ischemic attack (TIA), and cerebral infarction without residual deficits: Secondary | ICD-10-CM | POA: Diagnosis not present

## 2023-06-25 DIAGNOSIS — Z7982 Long term (current) use of aspirin: Secondary | ICD-10-CM | POA: Diagnosis not present

## 2023-06-25 DIAGNOSIS — F17210 Nicotine dependence, cigarettes, uncomplicated: Secondary | ICD-10-CM | POA: Insufficient documentation

## 2023-06-25 DIAGNOSIS — Z86718 Personal history of other venous thrombosis and embolism: Secondary | ICD-10-CM | POA: Diagnosis not present

## 2023-06-25 DIAGNOSIS — Z79899 Other long term (current) drug therapy: Secondary | ICD-10-CM | POA: Diagnosis not present

## 2023-06-25 DIAGNOSIS — Z86711 Personal history of pulmonary embolism: Secondary | ICD-10-CM | POA: Insufficient documentation

## 2023-06-25 LAB — CBC WITH DIFFERENTIAL/PLATELET
Abs Immature Granulocytes: 0.07 10*3/uL (ref 0.00–0.07)
Basophils Absolute: 0.1 10*3/uL (ref 0.0–0.1)
Basophils Relative: 1 %
Eosinophils Absolute: 0.3 10*3/uL (ref 0.0–0.5)
Eosinophils Relative: 2 %
HCT: 50.9 % — ABNORMAL HIGH (ref 36.0–46.0)
Hemoglobin: 17.2 g/dL — ABNORMAL HIGH (ref 12.0–15.0)
Immature Granulocytes: 1 %
Lymphocytes Relative: 26 %
Lymphs Abs: 3.2 10*3/uL (ref 0.7–4.0)
MCH: 34.1 pg — ABNORMAL HIGH (ref 26.0–34.0)
MCHC: 33.8 g/dL (ref 30.0–36.0)
MCV: 100.8 fL — ABNORMAL HIGH (ref 80.0–100.0)
Monocytes Absolute: 0.9 10*3/uL (ref 0.1–1.0)
Monocytes Relative: 7 %
Neutro Abs: 7.6 10*3/uL (ref 1.7–7.7)
Neutrophils Relative %: 63 %
Platelets: 334 10*3/uL (ref 150–400)
RBC: 5.05 MIL/uL (ref 3.87–5.11)
RDW: 14 % (ref 11.5–15.5)
WBC: 12.1 10*3/uL — ABNORMAL HIGH (ref 4.0–10.5)
nRBC: 0 % (ref 0.0–0.2)

## 2023-06-25 LAB — COMPREHENSIVE METABOLIC PANEL
ALT: 41 U/L (ref 0–44)
AST: 31 U/L (ref 15–41)
Albumin: 4.4 g/dL (ref 3.5–5.0)
Alkaline Phosphatase: 66 U/L (ref 38–126)
Anion gap: 6 (ref 5–15)
BUN: 14 mg/dL (ref 6–20)
CO2: 28 mmol/L (ref 22–32)
Calcium: 9.7 mg/dL (ref 8.9–10.3)
Chloride: 106 mmol/L (ref 98–111)
Creatinine, Ser: 0.76 mg/dL (ref 0.44–1.00)
GFR, Estimated: >60 mL/min (ref >60)
Glucose, Bld: 115 mg/dL — ABNORMAL HIGH (ref 70–99)
Potassium: 4.4 mmol/L (ref 3.5–5.1)
Sodium: 140 mmol/L (ref 135–145)
Total Bilirubin: 0.5 mg/dL (ref <1.2)
Total Protein: 7.1 g/dL (ref 6.5–8.1)

## 2023-06-25 LAB — LACTATE DEHYDROGENASE: LDH: 157 U/L (ref 98–192)

## 2023-06-25 NOTE — Progress Notes (Signed)
[facility]  [place], [place]  Clinic Day:  06/25/2023  Referring physician: Avanell Shackleton, NP-C  Chief Complaints: Erythrocytosis  History of Presenting Illness: Gina Ayala 47 y.o. female pleasant patient was been referred to [place] by Hetty Blend, NP for further evaluation of erythrocytosis incidentally noted on routine blood work. She has history of migraine in high school but more recently endorses back of head tension type headaches. She says she's tired all the time despite rest. Not worsening. She has a history of unprovoked stroke in 2017. Hypercoaguable workup at that time was negative. No other history of blood clots. She has history of fibroids and has been referred to gynecology. She has been referred for sleep study as well but denies history of sleep apnea though she feels like this is possible. No abnormal bruising. Denies unexplained fever, or unintentional weight loss. Denies pruritis after bathing or erythromelalgia, gout, or early satiety. Denies use of volume depleting medications. Patient has not had a sleep study. Denies history of lung disease. Denies exposure to toxins, regular exposure to toxins such as carbon monioxide, use of androgens, EPO, or steroids.   She is a smoker- 1/2 ppd over past month which is reduction.  No history of lung disease or COPD.  OSA/CPAP: possible Hx of DVT/PE or Stroke: yes Diuretics: no  She has a history of stroke in 2017. Hypercoaguable workup at that time was normal. She received short term anticoagulation ten transitioned to aspirin due to cost. Was followed by Dr. Pearlean Brownie but lost to follow up after 2017.   Denies family history of malignancy.    Review of Systems  Constitutional:  Positive for fatigue. Negative for appetite change, fever and unexpected weight change.  HENT:   Negative for lump/mass, mouth sores, nosebleeds, sore throat and trouble swallowing.   Eyes:  Negative for eye problems.  Respiratory:   Negative for chest tightness and shortness of breath.   Cardiovascular:  Negative for chest pain, leg swelling and palpitations.  Gastrointestinal:  Negative for abdominal pain, blood in stool, constipation, diarrhea, nausea and vomiting.  Endocrine: Positive for hot flashes.  Genitourinary:  Positive for bladder incontinence and vaginal bleeding. Negative for difficulty urinating, dysuria and pelvic pain.   Musculoskeletal:  Positive for arthralgias. Negative for back pain, flank pain, myalgias and neck stiffness.  Skin:  Negative for itching, rash and wound.  Neurological:  Positive for headaches. Negative for dizziness, light-headedness and numbness.  Hematological:  Negative for adenopathy. Does not bruise/bleed easily.  Psychiatric/Behavioral:  Negative for confusion, depression and sleep disturbance. The patient is not nervous/anxious.      Past Medical History:  Diagnosis Date   Allergy 05/23/1997   Anesthesia   Anxiety    Depression    Stroke [facility]) 2017   Thyroid disease 01/2022   Hashimoto's    Past Surgical History:  Procedure Laterality Date   CESAREAN SECTION     TEE WITHOUT CARDIOVERSION N/A 11/14/2015   Procedure: TRANSESOPHAGEAL ECHOCARDIOGRAM (TEE);  Surgeon: Pricilla Riffle, MD;  Location: [facility] ENDOSCOPY;  Service: Cardiovascular;  Laterality: N/A;    Family History  Problem Relation Age of Onset   Diabetes Mellitus II Mother    Diabetes Mother    Hypertension Father    Stroke Sister    Drug abuse Sister    Diabetes Maternal Grandmother    Early death Sister      reports that she has been smoking cigarettes. She started smoking about 33 years ago. She  has a 6.5 pack-year smoking history. She has never used smokeless tobacco. She reports current alcohol use of about 4.0 standard drinks of alcohol per week. She reports that she does not use drugs.  Allergies  Allergen Reactions   Anesthetics, Amide     Itching     Current Outpatient Medications  Medication  Sig Dispense Refill   ALPRAZolam (XANAX) 1 MG tablet Take 1 mg by mouth 4 (four) times daily as needed.     aspirin 81 MG tablet Take 81 mg by mouth daily.     levothyroxine (SYNTHROID) 88 MCG tablet Take 1 tablet (88 mcg total) by mouth daily. 30 tablet 1   progesterone (PROMETRIUM) 100 MG capsule Take 100 mg by mouth daily. (Patient not taking: Reported on 03/31/2023)     sertraline (ZOLOFT) 100 MG tablet Take 200 mg by mouth daily.     No current facility-administered medications for this visit.    Objective:  Blood pressure (!) 150/105, pulse 92, temperature 98 F (36.7 C), resp. rate 18, weight 295 lb 6.4 oz (134 kg), SpO2 98%.  Wt Readings from Last 3 Encounters:  06/25/23 295 lb 6.4 oz (134 kg)  05/26/23 291 lb (132 kg)  03/31/23 291 lb (132 kg)    Body mass index is 46.96 kg/m.  Performance status (ECOG): 1 - Symptomatic but completely ambulatory  Physical Exam Vitals reviewed.  Constitutional:      Appearance: She is obese. She is not ill-appearing.     Comments: unaccompanied  Eyes:     Extraocular Movements: Extraocular movements intact.     Pupils: Pupils are equal, round, and reactive to light.  Cardiovascular:     Rate and Rhythm: Normal rate and regular rhythm.  Pulmonary:     Effort: Pulmonary effort is normal.  Abdominal:     General: There is no distension.     Tenderness: There is no guarding.  Musculoskeletal:        General: No deformity.  Skin:    General: Skin is warm.     Coloration: Skin is not pale.     Findings: No erythema or rash.  Neurological:     Mental Status: She is alert and oriented to person, place, and time.  Psychiatric:        Mood and Affect: Mood normal.        Behavior: Behavior normal.        Latest Ref Rng & Units 06/25/2023   11:24 AM 05/26/2023   10:46 AM 03/31/2023   11:01 AM  CBC  WBC 4.0 - 10.5 K/uL 12.1  10.5  9.9   Hemoglobin 12.0 - 15.0 g/dL 78.4  69.6  29.5   Hematocrit 36.0 - 46.0 % 50.9  53.2  52.3    Platelets 150 - 400 K/uL 334  359.0  320.0       Latest Ref Rng & Units 06/25/2023   11:24 AM 05/26/2023   10:46 AM 03/31/2023   11:01 AM  CMP  Glucose 70 - 99 mg/dL 284  132  440   BUN 6 - 20 mg/dL 14  13  14    Creatinine 0.44 - 1.00 mg/dL 1.02  7.25  3.66   Sodium 135 - 145 mmol/L 140  141  141   Potassium 3.5 - 5.1 mmol/L 4.4  4.3  4.5   Chloride 98 - 111 mmol/L 106  106  104   CO2 22 - 32 mmol/L 28  25  28    Calcium  8.9 - 10.3 mg/dL 9.7  9.9  9.5   Total Protein 6.5 - 8.1 g/dL 7.1  7.1  6.9   Total Bilirubin <1.2 mg/dL 0.5  0.4  0.4   Alkaline Phos 38 - 126 U/L 66  70  71   AST 15 - 41 U/L 31  31  27    ALT 0 - 44 U/L 41  41  38    Lab Results  Component Value Date   TIBC 336 05/26/2023   TIBC 368 04/15/2021   FERRITIN 36 05/26/2023   FERRITIN 48 04/15/2021   IRONPCTSAT 31 05/26/2023   IRONPCTSAT 54 (H) 04/15/2021   Lab Results  Component Value Date   LDH 157 06/25/2023    No results found.    Assessment & Plan:  BELEN WESTMEYER is a 47 y.o. female who presents for new patient evaluation of erythrocytosis.   Erythrocytosis- Aug 2024 Hmg 17.2, Hct 52.3. Currently patient is mildly symptomatic to asymptomatic. There are two types of polycythemia, Primary polycythemia and secondary polycythemia. Primary polycythemia is overproduction of red blood cells due to a driver mutation. The most common mutation is the JAK2 [id number] (95% of cases), but there are other mutations which can cause this disorder. Primary polycythemia is a myeloproliferative neoplasm which may require cytoreductive therapy to decrease risk of thrombosis. This can consist of medications or phlebotomy to drive down the red blood cell counts. Secondary polycythemia is polycythemia driven by low oxygen levels. This represents an appropriate response of the body attempting to increase red cell volume. Causes of secondary polycythemia include smoking (most common), obstructive sleep apnea (OSA), or living at  altitude. Certain thalassemias can present with marked erythrocytosis , but normal hemoglobin. Secondary polycythemia does not have the same level of thrombotic risk and therefore does not require cytoreductive therapy or phlebotomy.    # For now, I recommend checking cbc, cmp, EPO, LDH, JAK2, BCR/ABL FISH, CO. Hold off on bone marrow for now, however, if workup unrevealing, I would recommend bone marrow. Patient in agreement.   # I discussed the role of phlebotomy in bringing the hematocrit/hemoglobin down to avoid any thromboembolic events. Reviewed that the role of secondary erythrocytosis is unclear however, in PV, standard of care if hct < 45. In secondary, I recommend goal < 50. Plan for 500 cc phlebotomy starting next week. Plan to repeat every 4 weeks until goal Hct < 50. Will add order for add back fluids in event of dizziness/orthostasis. These can be held if patient tolerates well.   Disposition:  Lab today Lab next week +/- phlebotomy 3 mo- lab (cbc), see heather or lacie, +/- phlebotomy- la      Thank you Hetty Blend, NP or allowing me to participate in the care of your pleasant patient. Please do not hesitate to contact me with questions or concerns in the interim.  I discussed the assessment and treatment plan with the patient.  The patient was provided an opportunity to ask questions and all were answered.  The patient agreed with the plan and demonstrated an understanding of the instructions.  The patient was advised to call back if the symptoms worsen or if the condition fails to improve as anticipated.  Consuello Masse, DNP, AGNP-C [facility]  CC: Hetty Blend, NP

## 2023-06-27 LAB — ERYTHROPOIETIN: Erythropoietin: 5.6 m[IU]/mL (ref 2.6–18.5)

## 2023-06-28 ENCOUNTER — Encounter: Payer: Self-pay | Admitting: Nurse Practitioner

## 2023-06-29 ENCOUNTER — Other Ambulatory Visit: Payer: Self-pay

## 2023-06-29 DIAGNOSIS — D751 Secondary polycythemia: Secondary | ICD-10-CM

## 2023-06-30 ENCOUNTER — Inpatient Hospital Stay: Payer: Managed Care, Other (non HMO) | Admitting: Hematology

## 2023-06-30 ENCOUNTER — Inpatient Hospital Stay: Payer: Managed Care, Other (non HMO)

## 2023-06-30 VITALS — BP 158/114 | HR 90 | Temp 97.7°F | Resp 16

## 2023-06-30 DIAGNOSIS — D751 Secondary polycythemia: Secondary | ICD-10-CM

## 2023-06-30 LAB — CBC WITH DIFFERENTIAL (CANCER CENTER ONLY)
Abs Immature Granulocytes: 0.04 10*3/uL (ref 0.00–0.07)
Basophils Absolute: 0.1 10*3/uL (ref 0.0–0.1)
Basophils Relative: 1 %
Eosinophils Absolute: 0.2 10*3/uL (ref 0.0–0.5)
Eosinophils Relative: 1 %
HCT: 50 % — ABNORMAL HIGH (ref 36.0–46.0)
Hemoglobin: 17.2 g/dL — ABNORMAL HIGH (ref 12.0–15.0)
Immature Granulocytes: 0 %
Lymphocytes Relative: 31 %
Lymphs Abs: 3.8 10*3/uL (ref 0.7–4.0)
MCH: 34 pg (ref 26.0–34.0)
MCHC: 34.4 g/dL (ref 30.0–36.0)
MCV: 98.8 fL (ref 80.0–100.0)
Monocytes Absolute: 0.9 10*3/uL (ref 0.1–1.0)
Monocytes Relative: 8 %
Neutro Abs: 7.4 10*3/uL (ref 1.7–7.7)
Neutrophils Relative %: 59 %
Platelet Count: 325 10*3/uL (ref 150–400)
RBC: 5.06 MIL/uL (ref 3.87–5.11)
RDW: 13.8 % (ref 11.5–15.5)
WBC Count: 12.4 10*3/uL — ABNORMAL HIGH (ref 4.0–10.5)
nRBC: 0 % (ref 0.0–0.2)

## 2023-06-30 NOTE — Progress Notes (Signed)
Patient here for her first phlebotomy. Per parameters- hold if hct <50. Patient hct is 50.0- discussed with patient. Moved forward with phlebotomy per order/pt wishes. 16g used in her R AC. 300g total were removed before the blood stopped flowing. Patient chose not to be stuck for another access. Food and fluids were offered. Blood pressure elevated- it was elevated when she saw Consuello Masse, NP- thought to be an anomaly, but remains high after phlebotomy and rest period post procedure. No signs of stroke and patient does not feel high pressure. On- call MD paged. Consuello Masse, NP contacted. Discussed patient condition. Told to discuss ER precautions with the patient and follow up with her PCP. Discussed potentially going to the [facility] ER, if the patient desired. Discussed signs of stroke. Patient did not feel any symptoms of stroke or even a headache. Patient stated that she will take it easy and call her PCP tomorrow.  BP (!) 158/114 (BP Location: Left Wrist, Patient Position: Sitting)   Pulse 90   Temp 97.7 F (36.5 C) (Oral)   Resp 16   SpO2 98%   Patient ambulatory to the lobby. Husband at home to help monitor.

## 2023-07-01 LAB — FERRITIN: Ferritin: 64 ng/mL (ref 11–307)

## 2023-07-04 LAB — CARBON MONOXIDE, BLOOD (PERFORMED AT REF LAB): Carbon Monoxide, Blood: 6 % — ABNORMAL HIGH (ref 0.0–3.6)

## 2023-07-11 LAB — BCR ABL1 FISH (GENPATH)

## 2023-07-11 LAB — JAK2 (INCLUDING V617F AND EXON 12), MPL,& CALR-NEXT GEN SEQ

## 2023-07-12 ENCOUNTER — Telehealth: Payer: Self-pay | Admitting: Nurse Practitioner

## 2023-07-12 DIAGNOSIS — D751 Secondary polycythemia: Secondary | ICD-10-CM

## 2023-07-12 NOTE — Telephone Encounter (Signed)
Left vm for patient to review results.   - JAK2 was negative. Likely secondary erthrocytosis.

## 2023-07-13 ENCOUNTER — Ambulatory Visit: Payer: Managed Care, Other (non HMO) | Admitting: Family Medicine

## 2023-07-21 ENCOUNTER — Ambulatory Visit: Payer: Managed Care, Other (non HMO) | Admitting: Family Medicine

## 2023-09-28 ENCOUNTER — Other Ambulatory Visit: Payer: Self-pay

## 2023-09-28 DIAGNOSIS — D751 Secondary polycythemia: Secondary | ICD-10-CM

## 2023-09-29 ENCOUNTER — Inpatient Hospital Stay: Payer: Managed Care, Other (non HMO)

## 2023-09-29 ENCOUNTER — Telehealth: Payer: Self-pay

## 2023-09-29 ENCOUNTER — Ambulatory Visit: Payer: Managed Care, Other (non HMO)

## 2023-09-29 ENCOUNTER — Inpatient Hospital Stay: Payer: Managed Care, Other (non HMO) | Attending: Nurse Practitioner | Admitting: Hematology

## 2023-09-29 NOTE — Telephone Encounter (Signed)
 Gina Ayala

## 2023-11-16 ENCOUNTER — Encounter: Payer: Self-pay | Admitting: Nurse Practitioner
